# Patient Record
Sex: Female | Born: 1935 | Race: White | Hispanic: No | Marital: Single | Smoking: Never smoker
Health system: Southern US, Community
[De-identification: ages and names within clinical notes are randomized; demographics above are authoritative.]

## PROBLEM LIST (undated history)

## (undated) DIAGNOSIS — I2510 Atherosclerotic heart disease of native coronary artery without angina pectoris: Secondary | ICD-10-CM

## (undated) DIAGNOSIS — I4891 Unspecified atrial fibrillation: Secondary | ICD-10-CM

## (undated) DIAGNOSIS — I10 Essential (primary) hypertension: Secondary | ICD-10-CM

## (undated) DIAGNOSIS — E785 Hyperlipidemia, unspecified: Secondary | ICD-10-CM

## (undated) HISTORY — PX: CARDIAC SURGERY: SHX584

---

## 2008-04-15 HISTORY — PX: CORONARY ARTERY BYPASS GRAFT: SHX141

## 2017-09-09 ENCOUNTER — Inpatient Hospital Stay (HOSPITAL_COMMUNITY)
Admission: EM | Admit: 2017-09-09 | Discharge: 2017-09-15 | DRG: 291 | Disposition: A | Discharge status: Home / Self Care | Payer: Medicare Other | Attending: Internal Medicine | Admitting: Internal Medicine

## 2017-09-09 ENCOUNTER — Emergency Department (HOSPITAL_COMMUNITY): Payer: Medicare Other

## 2017-09-09 DIAGNOSIS — R7303 Prediabetes: Secondary | ICD-10-CM | POA: Diagnosis present

## 2017-09-09 DIAGNOSIS — I480 Paroxysmal atrial fibrillation: Secondary | ICD-10-CM | POA: Diagnosis present

## 2017-09-09 DIAGNOSIS — Z823 Family history of stroke: Secondary | ICD-10-CM

## 2017-09-09 DIAGNOSIS — R0602 Shortness of breath: Secondary | ICD-10-CM

## 2017-09-09 DIAGNOSIS — I2510 Atherosclerotic heart disease of native coronary artery without angina pectoris: Secondary | ICD-10-CM | POA: Diagnosis present

## 2017-09-09 DIAGNOSIS — I4891 Unspecified atrial fibrillation: Secondary | ICD-10-CM | POA: Diagnosis present

## 2017-09-09 DIAGNOSIS — Z8249 Family history of ischemic heart disease and other diseases of the circulatory system: Secondary | ICD-10-CM

## 2017-09-09 DIAGNOSIS — I110 Hypertensive heart disease with heart failure: Principal | ICD-10-CM | POA: Diagnosis present

## 2017-09-09 DIAGNOSIS — I5031 Acute diastolic (congestive) heart failure: Secondary | ICD-10-CM | POA: Diagnosis present

## 2017-09-09 DIAGNOSIS — E785 Hyperlipidemia, unspecified: Secondary | ICD-10-CM | POA: Diagnosis present

## 2017-09-09 DIAGNOSIS — J209 Acute bronchitis, unspecified: Secondary | ICD-10-CM

## 2017-09-09 DIAGNOSIS — Z7982 Long term (current) use of aspirin: Secondary | ICD-10-CM

## 2017-09-09 DIAGNOSIS — Z951 Presence of aortocoronary bypass graft: Secondary | ICD-10-CM

## 2017-09-09 DIAGNOSIS — I083 Combined rheumatic disorders of mitral, aortic and tricuspid valves: Secondary | ICD-10-CM | POA: Diagnosis present

## 2017-09-09 DIAGNOSIS — I509 Heart failure, unspecified: Secondary | ICD-10-CM

## 2017-09-09 DIAGNOSIS — I420 Dilated cardiomyopathy: Secondary | ICD-10-CM | POA: Diagnosis present

## 2017-09-09 DIAGNOSIS — J810 Acute pulmonary edema: Secondary | ICD-10-CM

## 2017-09-09 DIAGNOSIS — J9601 Acute respiratory failure with hypoxia: Secondary | ICD-10-CM | POA: Diagnosis present

## 2017-09-09 DIAGNOSIS — J9811 Atelectasis: Secondary | ICD-10-CM | POA: Diagnosis present

## 2017-09-09 DIAGNOSIS — Z79899 Other long term (current) drug therapy: Secondary | ICD-10-CM

## 2017-09-09 DIAGNOSIS — Z7901 Long term (current) use of anticoagulants: Secondary | ICD-10-CM

## 2017-09-09 HISTORY — DX: Unspecified atrial fibrillation: I48.91

## 2017-09-09 HISTORY — DX: Essential (primary) hypertension: I10

## 2017-09-09 HISTORY — DX: Hyperlipidemia, unspecified: E78.5

## 2017-09-09 HISTORY — DX: Atherosclerotic heart disease of native coronary artery without angina pectoris: I25.10

## 2017-09-09 NOTE — ED Triage Notes (Addendum)
Per EMS, pt coming from home with complaints of worsening SOB for x1 week. Pt has been in [place] for the last two weeks and flew back yesterday, pt was worried it was jet lag. Pt did fall and hit face in [place] with swelling noted on face. Pt does have large Goiter on neck. Pt denies chest pain or any weakness at this time. Wheezing noted in upper airway. Pt received  albuterol, .  atrovent,  solumedrol via EMS. 93% R.O.

## 2017-09-10 ENCOUNTER — Emergency Department (HOSPITAL_COMMUNITY): Payer: Medicare Other

## 2017-09-10 ENCOUNTER — Other Ambulatory Visit: Payer: Self-pay

## 2017-09-10 ENCOUNTER — Inpatient Hospital Stay (HOSPITAL_COMMUNITY): Payer: Medicare Other

## 2017-09-10 ENCOUNTER — Encounter (HOSPITAL_COMMUNITY): Payer: Self-pay | Admitting: Emergency Medicine

## 2017-09-10 DIAGNOSIS — J9811 Atelectasis: Secondary | ICD-10-CM | POA: Diagnosis present

## 2017-09-10 DIAGNOSIS — I340 Nonrheumatic mitral (valve) insufficiency: Secondary | ICD-10-CM

## 2017-09-10 DIAGNOSIS — Z7901 Long term (current) use of anticoagulants: Secondary | ICD-10-CM | POA: Diagnosis not present

## 2017-09-10 DIAGNOSIS — I4891 Unspecified atrial fibrillation: Secondary | ICD-10-CM | POA: Diagnosis present

## 2017-09-10 DIAGNOSIS — I509 Heart failure, unspecified: Secondary | ICD-10-CM

## 2017-09-10 DIAGNOSIS — Z8249 Family history of ischemic heart disease and other diseases of the circulatory system: Secondary | ICD-10-CM | POA: Diagnosis not present

## 2017-09-10 DIAGNOSIS — I420 Dilated cardiomyopathy: Secondary | ICD-10-CM | POA: Diagnosis present

## 2017-09-10 DIAGNOSIS — Z7982 Long term (current) use of aspirin: Secondary | ICD-10-CM | POA: Diagnosis not present

## 2017-09-10 DIAGNOSIS — I110 Hypertensive heart disease with heart failure: Secondary | ICD-10-CM | POA: Diagnosis present

## 2017-09-10 DIAGNOSIS — Z79899 Other long term (current) drug therapy: Secondary | ICD-10-CM | POA: Diagnosis not present

## 2017-09-10 DIAGNOSIS — Z823 Family history of stroke: Secondary | ICD-10-CM | POA: Diagnosis not present

## 2017-09-10 DIAGNOSIS — I5031 Acute diastolic (congestive) heart failure: Secondary | ICD-10-CM | POA: Diagnosis present

## 2017-09-10 DIAGNOSIS — J810 Acute pulmonary edema: Secondary | ICD-10-CM | POA: Diagnosis not present

## 2017-09-10 DIAGNOSIS — I480 Paroxysmal atrial fibrillation: Secondary | ICD-10-CM | POA: Diagnosis present

## 2017-09-10 DIAGNOSIS — R7303 Prediabetes: Secondary | ICD-10-CM | POA: Diagnosis present

## 2017-09-10 DIAGNOSIS — R0602 Shortness of breath: Secondary | ICD-10-CM | POA: Diagnosis present

## 2017-09-10 DIAGNOSIS — E785 Hyperlipidemia, unspecified: Secondary | ICD-10-CM | POA: Diagnosis present

## 2017-09-10 DIAGNOSIS — I083 Combined rheumatic disorders of mitral, aortic and tricuspid valves: Secondary | ICD-10-CM | POA: Diagnosis present

## 2017-09-10 DIAGNOSIS — I2510 Atherosclerotic heart disease of native coronary artery without angina pectoris: Secondary | ICD-10-CM | POA: Diagnosis present

## 2017-09-10 DIAGNOSIS — Z951 Presence of aortocoronary bypass graft: Secondary | ICD-10-CM | POA: Diagnosis not present

## 2017-09-10 DIAGNOSIS — J209 Acute bronchitis, unspecified: Secondary | ICD-10-CM | POA: Diagnosis present

## 2017-09-10 DIAGNOSIS — J9601 Acute respiratory failure with hypoxia: Secondary | ICD-10-CM | POA: Diagnosis present

## 2017-09-10 LAB — CBC WITH DIFFERENTIAL/PLATELET
Abs Immature Granulocytes: 0 10*3/uL (ref 0.0–0.1)
BASOS ABS: 0 10*3/uL (ref 0.0–0.1)
BASOS PCT: 1 %
EOS ABS: 0 10*3/uL (ref 0.0–0.7)
EOS PCT: 1 %
HCT: 33.5 % — ABNORMAL LOW (ref 36.0–46.0)
HEMOGLOBIN: 10.6 g/dL — AB (ref 12.0–15.0)
Immature Granulocytes: 1 %
LYMPHS PCT: 11 %
Lymphs Abs: 0.9 10*3/uL (ref 0.7–4.0)
MCH: 26.7 pg (ref 26.0–34.0)
MCHC: 31.6 g/dL (ref 30.0–36.0)
MCV: 84.4 fL (ref 78.0–100.0)
Monocytes Absolute: 0.8 10*3/uL (ref 0.1–1.0)
Monocytes Relative: 9 %
Neutro Abs: 6.9 10*3/uL (ref 1.7–7.7)
Neutrophils Relative %: 79 %
Platelets: 241 10*3/uL (ref 150–400)
RBC: 3.97 MIL/uL (ref 3.87–5.11)
RDW: 14.8 % (ref 11.5–15.5)
WBC: 8.7 10*3/uL (ref 4.0–10.5)

## 2017-09-10 LAB — I-STAT ARTERIAL BLOOD GAS, ED
ACID-BASE DEFICIT: 6 mmol/L — AB (ref 0.0–2.0)
Bicarbonate: 18.8 mmol/L — ABNORMAL LOW (ref 20.0–28.0)
O2 SAT: 90 %
PH ART: 7.368 (ref 7.350–7.450)
PO2 ART: 60 mmHg — AB (ref 83.0–108.0)
TCO2: 20 mmol/L — ABNORMAL LOW (ref 22–32)
pCO2 arterial: 32.7 mmHg (ref 32.0–48.0)

## 2017-09-10 LAB — GLUCOSE, CAPILLARY: Glucose-Capillary: 153 mg/dL — ABNORMAL HIGH (ref 65–99)

## 2017-09-10 LAB — I-STAT CHEM 8, ED
BUN: 20 mg/dL (ref 6–20)
CHLORIDE: 97 mmol/L — AB (ref 101–111)
Calcium, Ion: 1.13 mmol/L — ABNORMAL LOW (ref 1.15–1.40)
Creatinine, Ser: 0.9 mg/dL (ref 0.44–1.00)
GLUCOSE: 132 mg/dL — AB (ref 65–99)
HCT: 33 % — ABNORMAL LOW (ref 36.0–46.0)
HEMOGLOBIN: 11.2 g/dL — AB (ref 12.0–15.0)
Potassium: 3.7 mmol/L (ref 3.5–5.1)
SODIUM: 131 mmol/L — AB (ref 135–145)
TCO2: 21 mmol/L — AB (ref 22–32)

## 2017-09-10 LAB — BRAIN NATRIURETIC PEPTIDE: B NATRIURETIC PEPTIDE 5: 393 pg/mL — AB (ref 0.0–100.0)

## 2017-09-10 LAB — ECHOCARDIOGRAM COMPLETE

## 2017-09-10 LAB — I-STAT TROPONIN, ED: TROPONIN I, POC: 0 ng/mL (ref 0.00–0.08)

## 2017-09-10 MED ORDER — FUROSEMIDE 10 MG/ML IJ SOLN
40.0000 mg | Freq: Once | INTRAMUSCULAR | Status: AC
  Administered 2017-09-10: 40 mg via INTRAVENOUS
  Filled 2017-09-10: qty 4

## 2017-09-10 MED ORDER — ASPIRIN EC 81 MG PO TBEC
81.0000 mg | DELAYED_RELEASE_TABLET | Freq: Every day | ORAL | Status: DC
  Administered 2017-09-10 – 2017-09-15 (×6): 81 mg via ORAL
  Filled 2017-09-10 (×6): qty 1

## 2017-09-10 MED ORDER — SODIUM CHLORIDE 0.9 % IV SOLN: 250.0000 mL | INTRAVENOUS | Status: DC | PRN

## 2017-09-10 MED ORDER — ROSUVASTATIN CALCIUM 10 MG PO TABS
20.0000 mg | ORAL_TABLET | Freq: Every day | ORAL | Status: DC
  Administered 2017-09-10 – 2017-09-15 (×6): 20 mg via ORAL
  Filled 2017-09-10 (×2): qty 2
  Filled 2017-09-10: qty 1
  Filled 2017-09-10 (×3): qty 2
  Filled 2017-09-10: qty 1

## 2017-09-10 MED ORDER — FUROSEMIDE 10 MG/ML IJ SOLN: 40.0000 mg | Freq: Every day | INTRAMUSCULAR | Status: DC

## 2017-09-10 MED ORDER — IOPAMIDOL (ISOVUE-370) INJECTION 76%
100.0000 mL | Freq: Once | INTRAVENOUS | Status: AC | PRN
  Administered 2017-09-10: 65 mL via INTRAVENOUS

## 2017-09-10 MED ORDER — METOPROLOL SUCCINATE ER 25 MG PO TB24
75.0000 mg | ORAL_TABLET | Freq: Every day | ORAL | Status: DC
  Administered 2017-09-10 – 2017-09-15 (×6): 75 mg via ORAL
  Filled 2017-09-10 (×7): qty 1

## 2017-09-10 MED ORDER — FUROSEMIDE 10 MG/ML IJ SOLN
40.0000 mg | Freq: Two times a day (BID) | INTRAMUSCULAR | Status: AC
  Administered 2017-09-10 (×2): 40 mg via INTRAVENOUS
  Filled 2017-09-10 (×2): qty 4

## 2017-09-10 MED ORDER — ONDANSETRON HCL 4 MG/2ML IJ SOLN: 4.0000 mg | Freq: Four times a day (QID) | INTRAMUSCULAR | Status: DC | PRN

## 2017-09-10 MED ORDER — SODIUM CHLORIDE 0.9% FLUSH: 3.0000 mL | INTRAVENOUS | Status: DC | PRN

## 2017-09-10 MED ORDER — IOPAMIDOL (ISOVUE-370) INJECTION 76%
INTRAVENOUS | Status: AC
  Filled 2017-09-10: qty 100

## 2017-09-10 MED ORDER — SODIUM CHLORIDE 0.9% FLUSH
3.0000 mL | Freq: Two times a day (BID) | INTRAVENOUS | Status: DC
  Administered 2017-09-10 – 2017-09-15 (×11): 3 mL via INTRAVENOUS

## 2017-09-10 MED ORDER — ACETAMINOPHEN 325 MG PO TABS: 650.0000 mg | ORAL_TABLET | ORAL | Status: DC | PRN

## 2017-09-10 MED ORDER — APIXABAN 2.5 MG PO TABS
5.0000 mg | ORAL_TABLET | Freq: Two times a day (BID) | ORAL | Status: DC
  Administered 2017-09-10 – 2017-09-15 (×11): 5 mg via ORAL
  Filled 2017-09-10: qty 1
  Filled 2017-09-10 (×8): qty 2
  Filled 2017-09-10 (×2): qty 1
  Filled 2017-09-10 (×3): qty 2

## 2017-09-10 NOTE — ED Notes (Signed)
Pt alert, recently ambulated to bed side commode in room, no distress noted, denies complaints

## 2017-09-10 NOTE — Progress Notes (Signed)
  Echocardiogram 2D Echocardiogram has been performed.  Celene Skeen 09/10/2017, 3:29 PM

## 2017-09-10 NOTE — ED Triage Notes (Signed)
TC to pharmacy to request meds

## 2017-09-10 NOTE — ED Notes (Signed)
Resting on stretcher at present. No complaints.

## 2017-09-10 NOTE — ED Triage Notes (Signed)
Requested meds from Pharmacy.

## 2017-09-10 NOTE — ED Provider Notes (Signed)
[facility] EMERGENCY DEPARTMENT Provider Note   CSN: [id number] Arrival date & time: 09/09/17  2317     History   Chief Complaint Chief Complaint  Patient presents with  . Shortness of Breath    HPI Debbie Parker is a 84 y.o. female.  The history is provided by the patient and a relative.  Shortness of Breath  This is a new problem. The average episode lasts 4 days. The problem occurs continuously.The current episode started more than 2 days ago. The problem has been gradually worsening. Associated symptoms include wheezing. Pertinent negatives include no fever, no coryza, no neck pain, no cough, no sputum production, no hemoptysis, no vomiting and no abdominal pain. It is unknown what precipitated the problem. Risk factors include recent prolonged sitting. She has tried nothing for the symptoms. The treatment provided no relief. She has had no prior ICU admissions. Associated medical issues do not include PE.  Was in [place] and missed 2 weeks of Eliquis that she used for plaque in her neck, SOB the last few days.   Was supposed to travel home to [place] but was too short of breath tonight.  No f/c/r.  Fell in [place] 2 weeks ago obtaining facial bruising.  No n/v/d.  No weakness    Past Medical History:  Diagnosis Date  . A-fib (HCC)   . Coronary artery disease   . Hyperlipidemia   . Hypertension     Patient Active Problem List   Diagnosis Date Noted  . A-fib (HCC) 09/10/2017    Past Surgical History:  Procedure Laterality Date  . CARDIAC SURGERY    . CORONARY ARTERY BYPASS GRAFT  2010     OB History   None      Home Medications    Prior to Admission medications   Not on File    Family History History reviewed. No pertinent family history.  Social History Social History   Tobacco Use  . Smoking status: Not on file  . Smokeless tobacco: Never Used  Substance Use Topics  . Alcohol use: Not Currently    Frequency: Never  . Drug use:  Not Currently     Allergies   Patient has no allergy information on record.   Review of Systems Review of Systems  Constitutional: Negative for diaphoresis and fever.  HENT: Negative for drooling.   Eyes: Negative for visual disturbance.  Respiratory: Positive for shortness of breath and wheezing. Negative for cough, hemoptysis and sputum production.   Gastrointestinal: Negative for abdominal pain and vomiting.  Genitourinary: Negative for flank pain.  Musculoskeletal: Negative for back pain and neck pain.  Neurological: Negative for syncope, facial asymmetry and numbness.  Hematological: Negative for adenopathy.  All other systems reviewed and are negative.    Physical Exam Updated Vital Signs BP (!) 120/97   Pulse (!) 104   Temp 97.7 F (36.5 C) (Oral)   Resp (!) 28   SpO2 92%   Physical Exam  Constitutional: She is oriented to person, place, and time. She appears well-developed and well-nourished. No distress.  HENT:  Head: Normocephalic. Head is without raccoon's eyes and without Battle's sign.    Right Ear: No hemotympanum.  Left Ear: No hemotympanum.  Eyes: Pupils are equal, round, and reactive to light. Conjunctivae are normal.  Neck: Normal range of motion. Neck supple.  Cardiovascular: Normal rate and intact distal pulses. An irregular rhythm present.  Pulmonary/Chest: Tachypnea noted. She has wheezes.  Abdominal: Soft. Bowel sounds are  normal. She exhibits no mass. There is no tenderness. There is no rebound and no guarding.  Musculoskeletal: She exhibits no tenderness.  Lymphadenopathy:    She has no cervical adenopathy.  Neurological: She is alert and oriented to person, place, and time. She displays normal reflexes.  Skin: Skin is warm and dry. Capillary refill takes less than 2 seconds.  Psychiatric: She has a normal mood and affect.     ED Treatments / Results  Labs (all labs ordered are listed, but only abnormal results are displayed) Results  for orders placed or performed during the hospital encounter of 09/09/17  Brain natriuretic peptide  Result Value Ref Range   B Natriuretic Peptide 393.0 (H) 0.0 - 100.0 pg/mL  CBC with Differential  Result Value Ref Range   WBC 8.7 4.0 - 10.5 K/uL   RBC 3.97 3.87 - 5.11 MIL/uL   Hemoglobin 10.6 (L) 12.0 - 15.0 g/dL   HCT 16.1 (L) 09.6 - 04.5 %   MCV 84.4 78.0 - 100.0 fL   MCH 26.7 26.0 - 34.0 pg   MCHC 31.6 30.0 - 36.0 g/dL   RDW 40.9 81.1 - 91.4 %   Platelets 241 150 - 400 K/uL   Neutrophils Relative % 79 %   Neutro Abs 6.9 1.7 - 7.7 K/uL   Lymphocytes Relative 11 %   Lymphs Abs 0.9 0.7 - 4.0 K/uL   Monocytes Relative 9 %   Monocytes Absolute 0.8 0.1 - 1.0 K/uL   Eosinophils Relative 1 %   Eosinophils Absolute 0.0 0.0 - 0.7 K/uL   Basophils Relative 1 %   Basophils Absolute 0.0 0.0 - 0.1 K/uL   Immature Granulocytes 1 %   Abs Immature Granulocytes 0.0 0.0 - 0.1 K/uL  I-Stat Troponin, ED (not at [facility])  Result Value Ref Range   Troponin i, poc 0.00 0.00 - 0.08 ng/mL   Comment 3          I-Stat Chem 8, ED  Result Value Ref Range   Sodium 131 (L) 135 - 145 mmol/L   Potassium 3.7 3.5 - 5.1 mmol/L   Chloride 97 (L) 101 - 111 mmol/L   BUN 20 6 - 20 mg/dL   Creatinine, Ser 7.82 0.44 - 1.00 mg/dL   Glucose, Bld 956 (H) 65 - 99 mg/dL   Calcium, Ion 2.13 (L) 1.15 - 1.40 mmol/L   TCO2 21 (L) 22 - 32 mmol/L   Hemoglobin 11.2 (L) 12.0 - 15.0 g/dL   HCT 08.6 (L) 57.8 - 46.9 %  I-Stat arterial blood gas, ED  Result Value Ref Range   pH, Arterial 7.368 7.350 - 7.450   pCO2 arterial 32.7 32.0 - 48.0 mmHg   pO2, Arterial 60.0 (L) 83.0 - 108.0 mmHg   Bicarbonate 18.8 (L) 20.0 - 28.0 mmol/L   TCO2 20 (L) 22 - 32 mmol/L   O2 Saturation 90.0 %   Acid-base deficit 6.0 (H) 0.0 - 2.0 mmol/L   Patient temperature 98.6 F    Collection site RADIAL, ALLEN'S TEST ACCEPTABLE    Drawn by RT    Sample type ARTERIAL    Ct Angio Chest Pe W And/or Wo Contrast  Result Date: 09/10/2017 CLINICAL  DATA:  84 y/o  F; 1 week of shortness of breath. EXAM: CT ANGIOGRAPHY CHEST WITH CONTRAST TECHNIQUE: Multidetector CT imaging of the chest was performed using the standard protocol during bolus administration of intravenous contrast. Multiplanar CT image reconstructions and MIPs were obtained to evaluate the vascular anatomy.  CONTRAST:  65mL ISOVUE-370 IOPAMIDOL (ISOVUE-370) INJECTION 76% COMPARISON:  09/09/2017 chest radiograph. FINDINGS: Cardiovascular: 4 cm ascending aorta. Moderate calcific atherosclerosis of aorta. Status post CABG with LIMA and saphenous grafts. Cardiomegaly with severe biatrial enlargement. Coronary artery and aortic valvular calcific atherosclerosis. Respiratory motion artifact. Satisfactory opacification of pulmonary arteries. No pulmonary embolus identified. Mediastinum/Nodes: Thyroid isthmus nodule measuring 4.4 x 3.7 cm. No mediastinal adenopathy. Small hiatal hernia. Lungs/Pleura: Small right larger than left pleural effusions. Smooth interlobular septal thickening. Patchy dependent opacities in the lower lobes. Upper Abdomen: No acute abnormality. Musculoskeletal: No chest wall abnormality. No acute or significant osseous findings. Review of the MIP images confirms the above findings. IMPRESSION: 1. No pulmonary embolus identified. 2. Interstitial pulmonary edema and small bilateral pleural effusions. Mild dependent atelectasis in the lower lobes. 3. 4 cm ascending aortic aneurysm. Recommend annual imaging followup by CTA or MRA. This recommendation follows 2010 ACCF/AHA/AATS/ACR/ASA/SCA/SCAI/SIR/STS/SVM Guidelines for the Diagnosis and Management of Patients with Thoracic Aortic Disease. Circulation. 2010; 121: R604-V409. 4. Cardiomegaly with severe biatrial enlargement. 5. Coronary artery, aortic, and aortic valvular calcific atherosclerosis. 6. Thyroid isthmus nodule measuring up to 4.4 cm. Thyroid ultrasound is recommended on a nonemergent basis. 7. Small hiatal hernia.  Electronically Signed   By: Mitzi Hansen M.D.   On: 09/10/2017 01:18   Dg Chest Port 1 View  Result Date: 09/10/2017 CLINICAL DATA:  Acute onset of shortness of breath. Facial swelling. Wheezing. EXAM: PORTABLE CHEST 1 VIEW COMPARISON:  None. FINDINGS: The lungs are well-aerated. Small bilateral pleural effusions are noted. Increased interstitial markings may reflect mild interstitial edema. No pneumothorax is seen. The cardiomediastinal silhouette is mildly enlarged. The patient is status post median sternotomy. No acute osseous abnormalities are seen. IMPRESSION: Small bilateral pleural effusions noted. Increased interstitial markings may reflect mild interstitial edema. Mild cardiomegaly. Electronically Signed   By: Roanna Raider M.D.   On: 09/10/2017 00:03    EKG EKG Interpretation  Date/Time:  Tuesday Sep 09 2017 23:52:35 EDT Ventricular Rate:  115 PR Interval:    QRS Duration: 90 QT Interval:  296 QTC Calculation: 410 R Axis:   42 Text Interpretation:  Atrial fibrillation Nonspecific repol abnormality, diffuse leads Confirmed by Nicanor Alcon, Ceanna Wareing ([id number]) on 09/10/2017 12:24:11 AM   Radiology Ct Angio Chest Pe W And/or Wo Contrast  Result Date: 09/10/2017 CLINICAL DATA:  84 y/o  F; 1 week of shortness of breath. EXAM: CT ANGIOGRAPHY CHEST WITH CONTRAST TECHNIQUE: Multidetector CT imaging of the chest was performed using the standard protocol during bolus administration of intravenous contrast. Multiplanar CT image reconstructions and MIPs were obtained to evaluate the vascular anatomy. CONTRAST:  65mL ISOVUE-370 IOPAMIDOL (ISOVUE-370) INJECTION 76% COMPARISON:  09/09/2017 chest radiograph. FINDINGS: Cardiovascular: 4 cm ascending aorta. Moderate calcific atherosclerosis of aorta. Status post CABG with LIMA and saphenous grafts. Cardiomegaly with severe biatrial enlargement. Coronary artery and aortic valvular calcific atherosclerosis. Respiratory motion artifact. Satisfactory  opacification of pulmonary arteries. No pulmonary embolus identified. Mediastinum/Nodes: Thyroid isthmus nodule measuring 4.4 x 3.7 cm. No mediastinal adenopathy. Small hiatal hernia. Lungs/Pleura: Small right larger than left pleural effusions. Smooth interlobular septal thickening. Patchy dependent opacities in the lower lobes. Upper Abdomen: No acute abnormality. Musculoskeletal: No chest wall abnormality. No acute or significant osseous findings. Review of the MIP images confirms the above findings. IMPRESSION: 1. No pulmonary embolus identified. 2. Interstitial pulmonary edema and small bilateral pleural effusions. Mild dependent atelectasis in the lower lobes. 3. 4 cm ascending aortic aneurysm. Recommend annual imaging followup by CTA  or MRA. This recommendation follows 2010 ACCF/AHA/AATS/ACR/ASA/SCA/SCAI/SIR/STS/SVM Guidelines for the Diagnosis and Management of Patients with Thoracic Aortic Disease. Circulation. 2010; 121: Z610-R604. 4. Cardiomegaly with severe biatrial enlargement. 5. Coronary artery, aortic, and aortic valvular calcific atherosclerosis. 6. Thyroid isthmus nodule measuring up to 4.4 cm. Thyroid ultrasound is recommended on a nonemergent basis. 7. Small hiatal hernia. Electronically Signed   By: Mitzi Hansen M.D.   On: 09/10/2017 01:18   Dg Chest Port 1 View  Result Date: 09/10/2017 CLINICAL DATA:  Acute onset of shortness of breath. Facial swelling. Wheezing. EXAM: PORTABLE CHEST 1 VIEW COMPARISON:  None. FINDINGS: The lungs are well-aerated. Small bilateral pleural effusions are noted. Increased interstitial markings may reflect mild interstitial edema. No pneumothorax is seen. The cardiomediastinal silhouette is mildly enlarged. The patient is status post median sternotomy. No acute osseous abnormalities are seen. IMPRESSION: Small bilateral pleural effusions noted. Increased interstitial markings may reflect mild interstitial edema. Mild cardiomegaly. Electronically  Signed   By: Roanna Raider M.D.   On: 09/10/2017 00:03    Procedures Procedures (including critical care time)  Medications Ordered in ED Medications  furosemide (LASIX) injection 40 mg (has no administration in time range)  metoprolol succinate (TOPROL-XL) 24 hr tablet 75 mg (has no administration in time range)  apixaban (ELIQUIS) tablet 5 mg (has no administration in time range)  rosuvastatin (CRESTOR) tablet 20 mg (has no administration in time range)  aspirin EC tablet 81 mg (has no administration in time range)  iopamidol (ISOVUE-370) 76 % injection 100 mL (65 mLs Intravenous Contrast Given 09/10/17 0051)   MDM Reviewed: previous chart, nursing note and vitals Interpretation: labs, ECG, x-ray and ultrasound (negative troponin pleural effusions on CXR and CT ) Consults: admitting MD      Final Clinical Impressions(s) / ED Diagnoses   Final diagnoses:  Atrial fibrillation, unspecified type (HCC)  Acute pulmonary edema (HCC)    Admit to medicine    Marian Grandt, MD 09/10/17 0200

## 2017-09-10 NOTE — Progress Notes (Signed)
  PROGRESS NOTE    Julyana Parker  ZHY:[id number] DOB: December 28, 1929 DOA: 09/09/2017 PCP: System, Provider Not In   Patient admitted by Dr. Julian Reil this morning for shortness of breath secondary to fluid overload.  When I saw the patient she appeared more comfortable as she had received a dose of IV Lasix and put out close to 1 L of urine.  CT of the chest and earlier was negative for pulmonary embolism but had showed signs of fluid overload.  Patient denied any previous history of CHF.  She has had progressive increasing orthopnea and lower extremity swelling over the course of past few weeks but denies any chest pain.  She also admits of having productive coughing bringing up clear sputum, denies fevers and chills.  No sick contacts.  General = no fevers, chills, dizziness, malaise, fatigue HEENT/EYES = negative for pain, redness, loss of vision, double vision, blurred vision, loss of hearing, sore throat, hoarseness, dysphagia Cardiovascular= negative for chest pain, palpitation, murmurs, lower extremity swelling Respiratory/lungs= negative for hemoptysis, wheezing, mucus production Gastrointestinal= negative for nausea, vomiting,, abdominal pain, melena, hematemesis Genitourinary= negative for Dysuria, Hematuria, Change in Urinary Frequency MSK = Negative for arthralgia, myalgias, Back Pain, Joint swelling  Neurology= Negative for headache, seizures, numbness, tingling  Psychiatry= Negative for anxiety, depression, suicidal and homocidal ideation Allergy/Immunology= Medication/Food allergy as listed  Skin= Negative for Rash, lesions, ulcers, itching  Vitals:   09/10/17 0645 09/10/17 0742  BP: (!) 145/85 122/83  Pulse: 86 (!) 104  Resp: (!) 24 (!) 22  Temp:    SpO2: 99% 99%   Constitutional: NAD, calm, comfortable Eyes: PERRL, lids and conjunctivae normal ENMT: Mucous membranes are moist. Posterior pharynx clear of any exudate or lesions.Normal dentition.  Neck: normal, supple, no masses,  no thyromegaly.  12 cm JVD Respiratory: Bibasilar crackles Cardiovascular: Regular rate and rhythm, no murmurs / rubs / gallops.  1+ bilateral lower extremity pitting edema-she has compression stockings in place from home.. 2+ pedal pulses. No carotid bruits.  Abdomen: no tenderness, no masses palpated. No hepatosplenomegaly. Bowel sounds positive.  Musculoskeletal: no clubbing / cyanosis. No joint deformity upper and lower extremities. Good ROM, no contractures. Normal muscle tone.  Skin: no rashes, lesions, ulcers. No induration Neurologic: CN 2-12 grossly intact. Sensation intact, DTR normal. Strength 5/5 in all 4.  Psychiatric: Normal judgment and insight. Alert and oriented x 3. Normal mood.   CT of the chest is negative for pulmonary embolism but suggestive of pulmonary edema. EKG does not show any acute ST-T changes  Assessment and plan: Mild to moderate respiratory distress secondary to fluid overload - Patient does not have any diagnosis of cardiomyopathy.  Echocardiogram has been ordered.  We will increase her daily IV Lasix to twice daily for 2 more doses as she had responded well to 1 dose overnight.  We will reassess her tomorrow morning to see if she needs further more dosing.  Closely monitor electrolytes.  We will place her on fluid restriction.  Daily weight, check input output.  Closely monitor electrolytes  History of atrial fibrillation -Continue Eliquis.  Currently rate controlled on Toprol-XL 75 mg  Hyperlipidemia -Continue Crestor  Mirayah Wren Joline Maxcy, MD [facility] Pager [phone]   If 7PM-7AM, please contact night-coverage [email or web address] Password [facility] 09/10/2017, 11:49 AM

## 2017-09-10 NOTE — H&P (Signed)
History and Physical    Kimisha Eunice UJW:[id number] DOB: [birth year] DOA: 09/09/2017  PCP: System, Provider Not In  Patient coming from: Home  I have personally briefly reviewed patient's old medical records in [facility]  Chief Complaint: SOB  HPI: Debbie Parker is a 84 y.o. female with medical history significant of a.fib on eliquis (which she has missed for the past couple of weeks), HTN, HLD, CAD s/p CABG in 2010.  Patient does not have PMH of CHF previously.  Patient presents to the ED with c/o progressive SOB for the past 4 days.  She also has baseline DOE and orthopnea as well.  Patient recently traveled to [place] and just flew back to the area, was supposed to travel home to [place] at 8am this morning but too short of breath.  Did have fall 2 weeks ago in [place] and has hematoma over eye from this.  BLE edema has been ongoing for the past months to year.   ED Course: CT chest demonstrates no PE, does show pulmonary edema though.   IV lasix ordered.   Review of Systems: As per HPI otherwise 10 point review of systems negative.   Past Medical History:  Diagnosis Date  . A-fib (HCC)   . Coronary artery disease   . Hyperlipidemia   . Hypertension     Past Surgical History:  Procedure Laterality Date  . CARDIAC SURGERY    . CORONARY ARTERY BYPASS GRAFT  2010     does not have a smoking history on file. She has never used smokeless tobacco. She reports that she drank alcohol. She reports that she has current or past drug history.  No Known Allergies  Family History  Problem Relation Age of Onset  . CAD Mother   . Stroke Brother      Prior to Admission medications   Medication Sig Start Date End Date Taking? Authorizing Provider  albuterol (PROVENTIL HFA;VENTOLIN HFA) 108 (90 Base) MCG/ACT inhaler Inhale 1-2 puffs into the lungs every 6 (six) hours as needed for wheezing or shortness of breath.   Yes [provider]  amLODipine (NORVASC) 10  MG tablet Take 10 mg by mouth daily.   Yes [provider]  apixaban (ELIQUIS) 5 MG TABS tablet Take 5 mg by mouth every morning.   Yes [provider]  aspirin EC 81 MG tablet Take 81 mg by mouth daily.   Yes [provider]  cholecalciferol (VITAMIN D) 1000 units tablet Take 2,000 Units by mouth daily.   Yes [provider]  metoprolol succinate (TOPROL-XL) 50 MG 24 hr tablet Take 75 mg by mouth daily. Take with or immediately following a meal.   Yes [provider]  rosuvastatin (CRESTOR) 20 MG tablet Take 20 mg by mouth daily.   Yes [provider]    Physical Exam: Vitals:   09/09/17 2329 09/09/17 2355 09/10/17 0000 09/10/17 0003  BP: 115/82  (!) 120/97   Pulse: 77 100 (!) 123 (!) 104  Resp: (!) 22 (!) 29 (!) 35 (!) 28  Temp: 97.7 F (36.5 C)     TempSrc: Oral     SpO2: (!) 88% 90% 90% 92%    Constitutional: NAD, calm, comfortable Eyes: PERRL, lids and conjunctivae normal, hematoma over L eye ENMT: Mucous membranes are moist. Posterior pharynx clear of any exudate or lesions.Normal dentition.  Neck: normal, supple, no masses, no thyromegaly Respiratory: Bibasilar crackles Cardiovascular: Regular rate and rhythm, no murmurs / rubs / gallops.  No extremity edema. 2+ pedal pulses. No carotid bruits.  Abdomen: no tenderness, no masses palpated. No hepatosplenomegaly. Bowel sounds positive.  Musculoskeletal: no clubbing / cyanosis. No joint deformity upper and lower extremities. Good ROM, no contractures. Normal muscle tone.  Skin: no rashes, lesions, ulcers. No induration Neurologic: CN 2-12 grossly intact. Sensation intact, DTR normal. Strength 5/5 in all 4.  Psychiatric: Normal judgment and insight. Alert and oriented x 3. Normal mood.    Labs on Admission: I have personally reviewed following labs and imaging studies  CBC: Recent Labs  Lab 09/10/17 0001 09/10/17 0006  WBC 8.7  --   NEUTROABS 6.9  --   HGB 10.6* 11.2*    HCT 33.5* 33.0*  MCV 84.4  --   PLT 241  --    Basic Metabolic Panel: Recent Labs  Lab 09/10/17 0006  NA 131*  K 3.7  CL 97*  GLUCOSE 132*  BUN 20  CREATININE 0.90   GFR: CrCl cannot be calculated (Unknown ideal weight.). Liver Function Tests: No results for input(s): AST, ALT, ALKPHOS, BILITOT, PROT, ALBUMIN in the last 168 hours. No results for input(s): LIPASE, AMYLASE in the last 168 hours. No results for input(s): AMMONIA in the last 168 hours. Coagulation Profile: No results for input(s): INR, PROTIME in the last 168 hours. Cardiac Enzymes: No results for input(s): CKTOTAL, CKMB, CKMBINDEX, TROPONINI in the last 168 hours. BNP (last 3 results) No results for input(s): PROBNP in the last 8760 hours. HbA1C: No results for input(s): HGBA1C in the last 72 hours. CBG: No results for input(s): GLUCAP in the last 168 hours. Lipid Profile: No results for input(s): CHOL, HDL, LDLCALC, TRIG, CHOLHDL, LDLDIRECT in the last 72 hours. Thyroid Function Tests: No results for input(s): TSH, T4TOTAL, FREET4, T3FREE, THYROIDAB in the last 72 hours. Anemia Panel: No results for input(s): VITAMINB12, FOLATE, FERRITIN, TIBC, IRON, RETICCTPCT in the last 72 hours. Urine analysis: No results found for: COLORURINE, APPEARANCEUR, LABSPEC, PHURINE, GLUCOSEU, HGBUR, BILIRUBINUR, KETONESUR, PROTEINUR, UROBILINOGEN, NITRITE, LEUKOCYTESUR  Radiological Exams on Admission: Ct Angio Chest Pe W And/or Wo Contrast  Result Date: 09/10/2017 CLINICAL DATA:  84 y/o  F; 1 week of shortness of breath. EXAM: CT ANGIOGRAPHY CHEST WITH CONTRAST TECHNIQUE: Multidetector CT imaging of the chest was performed using the standard protocol during bolus administration of intravenous contrast. Multiplanar CT image reconstructions and MIPs were obtained to evaluate the vascular anatomy. CONTRAST:  65mL ISOVUE-370 IOPAMIDOL (ISOVUE-370) INJECTION 76% COMPARISON:  09/09/2017 chest radiograph. FINDINGS: Cardiovascular:  4 cm ascending aorta. Moderate calcific atherosclerosis of aorta. Status post CABG with LIMA and saphenous grafts. Cardiomegaly with severe biatrial enlargement. Coronary artery and aortic valvular calcific atherosclerosis. Respiratory motion artifact. Satisfactory opacification of pulmonary arteries. No pulmonary embolus identified. Mediastinum/Nodes: Thyroid isthmus nodule measuring 4.4 x 3.7 cm. No mediastinal adenopathy. Small hiatal hernia. Lungs/Pleura: Small right larger than left pleural effusions. Smooth interlobular septal thickening. Patchy dependent opacities in the lower lobes. Upper Abdomen: No acute abnormality. Musculoskeletal: No chest wall abnormality. No acute or significant osseous findings. Review of the MIP images confirms the above findings. IMPRESSION: 1. No pulmonary embolus identified. 2. Interstitial pulmonary edema and small bilateral pleural effusions. Mild dependent atelectasis in the lower lobes. 3. 4 cm ascending aortic aneurysm. Recommend annual imaging followup by CTA or MRA. This recommendation follows 2010 ACCF/AHA/AATS/ACR/ASA/SCA/SCAI/SIR/STS/SVM Guidelines for the Diagnosis and Management of Patients with Thoracic Aortic Disease. Circulation. 2010; 121: W098-J191. 4. Cardiomegaly with severe biatrial enlargement. 5. Coronary artery, aortic, and aortic valvular  calcific atherosclerosis. 6. Thyroid isthmus nodule measuring up to 4.4 cm. Thyroid ultrasound is recommended on a nonemergent basis. 7. Small hiatal hernia. Electronically Signed   By: Mitzi Hansen M.D.   On: 09/10/2017 01:18   Dg Chest Port 1 View  Result Date: 09/10/2017 CLINICAL DATA:  Acute onset of shortness of breath. Facial swelling. Wheezing. EXAM: PORTABLE CHEST 1 VIEW COMPARISON:  None. FINDINGS: The lungs are well-aerated. Small bilateral pleural effusions are noted. Increased interstitial markings may reflect mild interstitial edema. No pneumothorax is seen. The cardiomediastinal silhouette is  mildly enlarged. The patient is status post median sternotomy. No acute osseous abnormalities are seen. IMPRESSION: Small bilateral pleural effusions noted. Increased interstitial markings may reflect mild interstitial edema. Mild cardiomegaly. Electronically Signed   By: Roanna Raider M.D.   On: 09/10/2017 00:03    EKG: Independently reviewed.  Assessment/Plan Principal Problem:   New onset of congestive heart failure (HCC) Active Problems:   A-fib (HCC)   Acute respiratory failure with hypoxia (HCC)    1. New onset CHF - Acute CHF, dont know if it systolic or diastolic yet. 1. CHF pathway 2. Lasix  IV daily, first dose now 3. Strict intake and output 4. Daily BMP 5. 2d echo 6. Tele monitor 2. Acute resp failure with hypoxia - secondary to #1 above 3. A.Fib - 1. Continue metoprolol 2. Continue / resume eliquis 4. HTN - 1. Cont Metoprolol 2. Holding CCB for the moment 3. Consider switching to ACEi if BP elevates.  DVT prophylaxis: Eliquis Code Status: Full Family Communication: Family at bedside Disposition Plan: Home after admit Consults called: None Admission status: Admit to inpatient - IP status for new O2 requirement   Hillary Bow DO [facility] Pager [phone]  If 7AM-7PM, please contact day team taking care of patient [email or web address] Password [email or web address]  09/10/2017, 2:06 AM

## 2017-09-11 DIAGNOSIS — J209 Acute bronchitis, unspecified: Secondary | ICD-10-CM

## 2017-09-11 LAB — GLUCOSE, CAPILLARY
GLUCOSE-CAPILLARY: 118 mg/dL — AB (ref 65–99)
GLUCOSE-CAPILLARY: 108 mg/dL — AB (ref 65–99)
GLUCOSE-CAPILLARY: 130 mg/dL — AB (ref 65–99)
GLUCOSE-CAPILLARY: 171 mg/dL — AB (ref 65–99)

## 2017-09-11 LAB — BASIC METABOLIC PANEL
ANION GAP: 13 (ref 5–15)
BUN: 21 mg/dL — ABNORMAL HIGH (ref 6–20)
CHLORIDE: 98 mmol/L — AB (ref 101–111)
CO2: 27 mmol/L (ref 22–32)
Calcium: 8.8 mg/dL — ABNORMAL LOW (ref 8.9–10.3)
Creatinine, Ser: 0.96 mg/dL (ref 0.44–1.00)
GFR calc Af Amer: >60 mL/min (ref >60)
GFR, EST NON AFRICAN AMERICAN: 52 mL/min — AB (ref >60)
GLUCOSE: 128 mg/dL — AB (ref 65–99)
POTASSIUM: 3.2 mmol/L — AB (ref 3.5–5.1)
Sodium: 138 mmol/L (ref 135–145)

## 2017-09-11 LAB — MAGNESIUM: Magnesium: 1.8 mg/dL (ref 1.7–2.4)

## 2017-09-11 MED ORDER — PREDNISONE 20 MG PO TABS
40.0000 mg | ORAL_TABLET | Freq: Every day | ORAL | Status: DC
  Administered 2017-09-11 – 2017-09-13 (×3): 40 mg via ORAL
  Filled 2017-09-11 (×3): qty 2

## 2017-09-11 MED ORDER — LEVALBUTEROL HCL 0.63 MG/3ML IN NEBU
0.6300 mg | INHALATION_SOLUTION | Freq: Three times a day (TID) | RESPIRATORY_TRACT | Status: DC
  Administered 2017-09-12: 0.63 mg via RESPIRATORY_TRACT
  Filled 2017-09-11: qty 3

## 2017-09-11 MED ORDER — FUROSEMIDE 10 MG/ML IJ SOLN
40.0000 mg | Freq: Two times a day (BID) | INTRAMUSCULAR | Status: DC
  Administered 2017-09-11: 40 mg via INTRAVENOUS
  Filled 2017-09-11 (×2): qty 4

## 2017-09-11 MED ORDER — MAGNESIUM OXIDE 400 (241.3 MG) MG PO TABS
800.0000 mg | ORAL_TABLET | Freq: Two times a day (BID) | ORAL | Status: AC
  Administered 2017-09-11 (×2): 800 mg via ORAL
  Filled 2017-09-11 (×2): qty 2

## 2017-09-11 MED ORDER — LEVALBUTEROL HCL 0.63 MG/3ML IN NEBU
0.6300 mg | INHALATION_SOLUTION | Freq: Four times a day (QID) | RESPIRATORY_TRACT | Status: DC
  Administered 2017-09-11 (×2): 0.63 mg via RESPIRATORY_TRACT
  Filled 2017-09-11 (×2): qty 3

## 2017-09-11 MED ORDER — POTASSIUM CHLORIDE CRYS ER 20 MEQ PO TBCR
40.0000 meq | EXTENDED_RELEASE_TABLET | Freq: Two times a day (BID) | ORAL | Status: AC
  Administered 2017-09-11 (×2): 40 meq via ORAL
  Filled 2017-09-11 (×2): qty 2

## 2017-09-11 NOTE — Discharge Instructions (Signed)

## 2017-09-11 NOTE — Evaluation (Signed)
Physical Therapy Evaluation Patient Details Name: Debbie Parker MRN: [id number] DOB: 1930/01/29 Today's Date: 09/11/2017   History of Present Illness  84 year old female with history of atrial fibrillation on Eliquis, CAD status post CABG in 2010, hypertension, hyperlipidemia came to the hospital with complaints of progressive shortness of breath over 4 days.  Initially due to patient's recent long distance travel she had a CT of the chest done which was negative for pulmonary embolism but showed signs of fluid overload.  She was started on IV Lasix for diuresis.  Clinical Impression  PTA pt was in [place] sightseeing with increasing difficulty with SoB and endurance as the trip progressed. Pt lives alone and was independent in mobility and ADLs. Pt is currently limited in her safe mobility by oxygen desaturation (see General Comments), SoB and decreased endurance. Pt is currently mod I for bed mobility, supervision for transfers and minA for ambulation without AD as she has mild instability in her gait. PT recommends d/c to her daughters home to recuperate and build strength and endurance before flying back to her home in [place]. PT will continue to work with pt acutely tomorrow for training with RW and for stair training before d/c.      Follow Up Recommendations Supervision for mobility/OOB    Equipment Recommendations  Rolling walker with 5" wheels    Recommendations for Other Services       Precautions / Restrictions Precautions Precautions: Fall Restrictions Weight Bearing Restrictions: No      Mobility  Bed Mobility Overal bed mobility: Modified Independent             General bed mobility comments: HoB elevated and use of handrails to come to EoB  Transfers Overall transfer level: Needs assistance Equipment used: None Transfers: Sit to/from Stand Sit to Stand: Supervision         General transfer comment: supervision for safety, good power up and  steadying  Ambulation/Gait Ambulation/Gait assistance: Min assist;Min guard Ambulation Distance (Feet): 200 Feet Assistive device: None Gait Pattern/deviations: Step-through pattern;Decreased stride length;Staggering left;Staggering right Gait velocity: slowed Gait velocity interpretation: <1.8 ft/sec, indicate of risk for recurrent falls General Gait Details: min guard to minA for steadying, pt reaching out for handrails in hall way to steady with walking, steates she holds on to her daughter when walking longer distances        Balance Overall balance assessment: Needs assistance Sitting-balance support: No upper extremity supported;Feet supported Sitting balance-Leahy Scale: Good     Standing balance support: No upper extremity supported;During functional activity Standing balance-Leahy Scale: Fair                               Pertinent Vitals/Pain Pain Assessment: No/denies pain    Home Living Family/patient expects to be discharged to:: Private residence Living Arrangements: Children Available Help at Discharge: Family;Available 24 hours/day Type of Home: House Home Access: Stairs to enter Entrance Stairs-Rails: None Entrance Stairs-Number of Steps: 5 Home Layout: Multi-level;Bed/bath upstairs Home Equipment: None      Prior Function Level of Independence: Independent               Hand Dominance        Extremity/Trunk Assessment   Upper Extremity Assessment Upper Extremity Assessment: Overall WFL for tasks assessed    Lower Extremity Assessment Lower Extremity Assessment: Overall WFL for tasks assessed       Communication   Communication: No difficulties  Cognition  Arousal/Alertness: Awake/alert Behavior During Therapy: WFL for tasks assessed/performed Overall Cognitive Status: Within Functional Limits for tasks assessed                                        General Comments General comments (skin integrity,  edema, etc.): Pt on 0.5 L O2 via nasal cannula on entry, and pt SoB, SaO2 93%O2, pt reports she was having some anxiety and that is possibly why she was short of breath, with ambulation SaO2 dropped to 87%O2, with 2/4 DoE, with standing rest break O2 rebounded to 92%O2, with sitting in chair after ambulation SaO2 96%O2        Assessment/Plan    PT Assessment Patient needs continued PT services  PT Problem List Cardiopulmonary status limiting activity;Decreased balance;Decreased mobility       PT Treatment Interventions DME instruction;Gait training;Stair training;Functional mobility training;Therapeutic activities;Therapeutic exercise;Balance training;Cognitive remediation;Patient/family education    PT Goals (Current goals can be found in the Care Plan section)  Acute Rehab PT Goals Patient Stated Goal: feel better PT Goal Formulation: With patient Time For Goal Achievement: 09/25/17 Potential to Achieve Goals: Good    Frequency Min 3X/week    AM-PAC PT "6 Clicks" Daily Activity  Outcome Measure Difficulty turning over in bed (including adjusting bedclothes, sheets and blankets)?: A Little Difficulty moving from lying on back to sitting on the side of the bed? : A Little Difficulty sitting down on and standing up from a chair with arms (e.g., wheelchair, bedside commode, etc,.)?: A Little Help needed moving to and from a bed to chair (including a wheelchair)?: A Little Help needed walking in hospital room?: A Little Help needed climbing 3-5 steps with a railing? : A Lot 6 Click Score: 17    End of Session Equipment Utilized During Treatment: Gait belt;Oxygen Activity Tolerance: Patient tolerated treatment well Patient left: in chair;with call bell/phone within reach;with chair alarm set Nurse Communication: Mobility status PT Visit Diagnosis: Unsteadiness on feet (R26.81);Other abnormalities of gait and mobility (R26.89);Difficulty in walking, not elsewhere classified  (R26.2)    Time: 4098-1191 PT Time Calculation (min) (ACUTE ONLY): 34 min   Charges:   PT Evaluation $PT Eval Moderate Complexity: 1 Mod PT Treatments $Gait Training: 8-22 mins   PT G Codes:        Emerlyn Mehlhoff B. Beverely Risen PT, DPT Acute Rehabilitation  [phone] Pager [phone]    Elon Alas Fleet 09/11/2017, 3:01 PM

## 2017-09-11 NOTE — Progress Notes (Signed)
PROGRESS NOTE    Debbie Parker  WUJ:[id number] DOB: December 06, 1929 DOA: 09/09/2017 PCP: System, Provider Not In   Brief Narrative:  84 year old female with history of atrial fibrillation on Eliquis, CAD status post CABG in 2010, hypertension, hyperlipidemia came to the hospital with complaints of progressive shortness of breath over 4 days.  Initially due to patient's recent long distance travel she had a CT of the chest done which was negative for pulmonary embolism but showed signs of fluid overload.  She was started on IV Lasix for diuresis.   Assessment & Plan:   Principal Problem:   New onset of congestive heart failure (HCC) Active Problems:   A-fib (HCC)   Acute respiratory failure with hypoxia (HCC)  Acute respiratory distress with hypoxia Fluid overload with dilated cardiomyopathy - Status post 2 doses of IV Lasix yesterday, will give her 2 more doses of IV Lasix again -Echocardiogram yesterday shows ejection fraction 60 to 65% without any wall motion abnormality.  Shows dilated cardiomyopathy - Fluid restriction 1800 cc, closely monitor input output -Wean off oxygen as necessary.  Closely monitor electrolytes Abnormal breath sounds with diffuse wheezing - Likely a component of fluid overload but also is diffuse in nature so could be from slight bronchospasm -We will give her a short burst of steroids, nebulizer treatments with Xopenex.  History of paroxysmal atrial fibrillation -Patient is on Toprol-XL 75 mg daily, Eliquis 5 mg twice daily.  Aspirin 81 mg  Coronary artery disease status post CABG in 2010 Hyperlipidemia -Continue aspirin, statin, beta-blocker  DVT prophylaxis: On Eliquis Code Status: Full code Family Communication: None at bedside Disposition Plan: Maintain inpatient stay for another 24-48 hours  Consultants:   None  Procedures:   None  Antimicrobials:   None   Subjective: Patient states she feels a little better this morning, no other  complaints besides mild exertional shortness of breath.  Lower extremity edema has improved.  Review of Systems Otherwise negative except as per HPI, including: General: Denies fever, chills, night sweats or unintended weight loss. Resp: Denies cough, wheezing Cardiac: Denies chest pain, palpitations, orthopnea, paroxysmal nocturnal dyspnea. GI: Denies abdominal pain, nausea, vomiting, diarrhea or constipation GU: Denies dysuria, frequency, hesitancy or incontinence MS: Denies muscle aches, joint pain or swelling Neuro: Denies headache, neurologic deficits (focal weakness, numbness, tingling), abnormal gait Psych: Denies anxiety, depression, SI/HI/AVH Skin: Denies new rashes or lesions ID: Denies sick contacts, exotic exposures, travel  Objective: Vitals:   09/10/17 1738 09/11/17 0025 09/11/17 0127 09/11/17 1004  BP: 108/75 100/68  104/77  Pulse: (!) 107 (!) 135  77  Resp: 20 (!) 22  (!) 24  Temp: 98.1 F (36.7 C) 97.9 F (36.6 C)  97.8 F (36.6 C)  TempSrc: Oral Oral  Oral  SpO2: 95% 92%  96%  Weight: 79.5 kg (175 lb 4.3 oz)  79.6 kg (175 lb 7.8 oz)   Height:  (1.6 m)       Intake/Output Summary (Last 24 hours) at 09/11/2017 1122 Last data filed at 09/10/2017 2030 Gross per 24 hour  Intake 103 ml  Output 1850 ml  Net -1747 ml   Filed Weights   09/10/17 1738 09/11/17 0127  Weight: 79.5 kg (175 lb 4.3 oz) 79.6 kg (175 lb 7.8 oz)    Examination:  General exam: Appears calm and comfortable, elderly frail-appearing.  Some facial ecchymosis from fall several weeks ago. Respiratory system: Diffuse expiratory wheezing Cardiovascular system: S1 & S2 heard, RRR. No JVD, murmurs, rubs, gallops or  clicks.  Trace pedal edema. Gastrointestinal system: Abdomen is nondistended, soft and nontender. No organomegaly or masses felt. Normal bowel sounds heard. Central nervous system: Alert and oriented. No focal neurological deficits. Extremities: Symmetric 5 x 5 power. Skin: No  rashes, lesions or ulcers Psychiatry: Judgement and insight appear normal. Mood & affect appropriate.     Data Reviewed:   CBC: Recent Labs  Lab 09/10/17 0001 09/10/17 0006  WBC 8.7  --   NEUTROABS 6.9  --   HGB 10.6* 11.2*  HCT 33.5* 33.0*  MCV 84.4  --   PLT 241  --    Basic Metabolic Panel: Recent Labs  Lab 09/10/17 0006 09/11/17 0243  NA 131* 138  K 3.7 3.2*  CL 97* 98*  CO2  --  27  GLUCOSE 132* 128*  BUN 20 21*  CREATININE 0.90 0.96  CALCIUM  --  8.8*  MG  --  1.8   GFR: Estimated Creatinine Clearance: 42.8 mL/min (by C-G formula based on SCr of 0.96 mg/dL). Liver Function Tests: No results for input(s): AST, ALT, ALKPHOS, BILITOT, PROT, ALBUMIN in the last 168 hours. No results for input(s): LIPASE, AMYLASE in the last 168 hours. No results for input(s): AMMONIA in the last 168 hours. Coagulation Profile: No results for input(s): INR, PROTIME in the last 168 hours. Cardiac Enzymes: No results for input(s): CKTOTAL, CKMB, CKMBINDEX, TROPONINI in the last 168 hours. BNP (last 3 results) No results for input(s): PROBNP in the last 8760 hours. HbA1C: No results for input(s): HGBA1C in the last 72 hours. CBG: Recent Labs  Lab 09/10/17 2150 09/11/17 0812  GLUCAP 153* 118*   Lipid Profile: No results for input(s): CHOL, HDL, LDLCALC, TRIG, CHOLHDL, LDLDIRECT in the last 72 hours. Thyroid Function Tests: No results for input(s): TSH, T4TOTAL, FREET4, T3FREE, THYROIDAB in the last 72 hours. Anemia Panel: No results for input(s): VITAMINB12, FOLATE, FERRITIN, TIBC, IRON, RETICCTPCT in the last 72 hours. Sepsis Labs: No results for input(s): PROCALCITON, LATICACIDVEN in the last 168 hours.  No results found for this or any previous visit (from the past 240 hour(s)).       Radiology Studies: Ct Angio Chest Pe W And/or Wo Contrast  Result Date: 09/10/2017 CLINICAL DATA:  84 y/o  F; 1 week of shortness of breath. EXAM: CT ANGIOGRAPHY CHEST WITH  CONTRAST TECHNIQUE: Multidetector CT imaging of the chest was performed using the standard protocol during bolus administration of intravenous contrast. Multiplanar CT image reconstructions and MIPs were obtained to evaluate the vascular anatomy. CONTRAST:  65mL ISOVUE-370 IOPAMIDOL (ISOVUE-370) INJECTION 76% COMPARISON:  09/09/2017 chest radiograph. FINDINGS: Cardiovascular: 4 cm ascending aorta. Moderate calcific atherosclerosis of aorta. Status post CABG with LIMA and saphenous grafts. Cardiomegaly with severe biatrial enlargement. Coronary artery and aortic valvular calcific atherosclerosis. Respiratory motion artifact. Satisfactory opacification of pulmonary arteries. No pulmonary embolus identified. Mediastinum/Nodes: Thyroid isthmus nodule measuring 4.4 x 3.7 cm. No mediastinal adenopathy. Small hiatal hernia. Lungs/Pleura: Small right larger than left pleural effusions. Smooth interlobular septal thickening. Patchy dependent opacities in the lower lobes. Upper Abdomen: No acute abnormality. Musculoskeletal: No chest wall abnormality. No acute or significant osseous findings. Review of the MIP images confirms the above findings. IMPRESSION: 1. No pulmonary embolus identified. 2. Interstitial pulmonary edema and small bilateral pleural effusions. Mild dependent atelectasis in the lower lobes. 3. 4 cm ascending aortic aneurysm. Recommend annual imaging followup by CTA or MRA. This recommendation follows 2010 ACCF/AHA/AATS/ACR/ASA/SCA/SCAI/SIR/STS/SVM Guidelines for the Diagnosis and Management of Patients with Thoracic  Aortic Disease. Circulation. 2010; 121: E454-U981. 4. Cardiomegaly with severe biatrial enlargement. 5. Coronary artery, aortic, and aortic valvular calcific atherosclerosis. 6. Thyroid isthmus nodule measuring up to 4.4 cm. Thyroid ultrasound is recommended on a nonemergent basis. 7. Small hiatal hernia. Electronically Signed   By: Mitzi Hansen M.D.   On: 09/10/2017 01:18   Dg  Chest Port 1 View  Result Date: 09/10/2017 CLINICAL DATA:  Acute onset of shortness of breath. Facial swelling. Wheezing. EXAM: PORTABLE CHEST 1 VIEW COMPARISON:  None. FINDINGS: The lungs are well-aerated. Small bilateral pleural effusions are noted. Increased interstitial markings may reflect mild interstitial edema. No pneumothorax is seen. The cardiomediastinal silhouette is mildly enlarged. The patient is status post median sternotomy. No acute osseous abnormalities are seen. IMPRESSION: Small bilateral pleural effusions noted. Increased interstitial markings may reflect mild interstitial edema. Mild cardiomegaly. Electronically Signed   By: Roanna Raider M.D.   On: 09/10/2017 00:03        Scheduled Meds: . apixaban  5 mg Oral BID  . aspirin EC  81 mg Oral Daily  . metoprolol succinate  75 mg Oral Daily  . rosuvastatin  20 mg Oral Daily  . sodium chloride flush  3 mL Intravenous Q12H   Continuous Infusions: . sodium chloride       LOS: 1 day    I have spent 35 minutes face to face with the patient and on the ward discussing the patients care, assessment, plan and disposition with other care givers. >50% of the time was devoted counseling the patient about the risks and benefits of treatment and coordinating care.     Taylan Mayhan Joline Maxcy, MD [facility] Pager [phone]   If 7PM-7AM, please contact night-coverage [email or web address] Password [email or web address] 09/11/2017, 11:22 AM

## 2017-09-12 LAB — BASIC METABOLIC PANEL
Anion gap: 7 (ref 5–15)
BUN: 26 mg/dL — ABNORMAL HIGH (ref 6–20)
CALCIUM: 8.5 mg/dL — AB (ref 8.9–10.3)
CO2: 28 mmol/L (ref 22–32)
Chloride: 102 mmol/L (ref 101–111)
Creatinine, Ser: 0.94 mg/dL (ref 0.44–1.00)
GFR, EST NON AFRICAN AMERICAN: 54 mL/min — AB (ref >60)
Glucose, Bld: 137 mg/dL — ABNORMAL HIGH (ref 65–99)
Potassium: 4.1 mmol/L (ref 3.5–5.1)
SODIUM: 137 mmol/L (ref 135–145)

## 2017-09-12 LAB — GLUCOSE, CAPILLARY
GLUCOSE-CAPILLARY: 125 mg/dL — AB (ref 65–99)
Glucose-Capillary: 119 mg/dL — ABNORMAL HIGH (ref 65–99)
Glucose-Capillary: 532 mg/dL (ref 65–99)
Glucose-Capillary: 163 mg/dL — ABNORMAL HIGH (ref 65–99)

## 2017-09-12 LAB — MAGNESIUM: MAGNESIUM: 2 mg/dL (ref 1.7–2.4)

## 2017-09-12 MED ORDER — INSULIN ASPART 100 UNIT/ML ~~LOC~~ SOLN: 0.0000 [IU] | Freq: Every day | SUBCUTANEOUS | Status: DC

## 2017-09-12 MED ORDER — INSULIN ASPART 100 UNIT/ML ~~LOC~~ SOLN
0.0000 [IU] | Freq: Three times a day (TID) | SUBCUTANEOUS | Status: DC
  Administered 2017-09-13: 2 [IU] via SUBCUTANEOUS
  Administered 2017-09-13: 3 [IU] via SUBCUTANEOUS
  Administered 2017-09-14: 2 [IU] via SUBCUTANEOUS

## 2017-09-12 MED ORDER — INSULIN ASPART 100 UNIT/ML ~~LOC~~ SOLN: 12.0000 [IU] | Freq: Once | SUBCUTANEOUS | Status: DC

## 2017-09-12 MED ORDER — LEVALBUTEROL HCL 0.63 MG/3ML IN NEBU: 0.6300 mg | INHALATION_SOLUTION | Freq: Four times a day (QID) | RESPIRATORY_TRACT | Status: DC | PRN

## 2017-09-12 MED ORDER — LEVALBUTEROL HCL 0.63 MG/3ML IN NEBU
0.6300 mg | INHALATION_SOLUTION | Freq: Two times a day (BID) | RESPIRATORY_TRACT | Status: DC
  Administered 2017-09-12: 0.63 mg via RESPIRATORY_TRACT
  Filled 2017-09-12: qty 3

## 2017-09-12 MED ORDER — FUROSEMIDE 10 MG/ML IJ SOLN
40.0000 mg | Freq: Three times a day (TID) | INTRAMUSCULAR | Status: AC
  Administered 2017-09-12: 40 mg via INTRAVENOUS
  Filled 2017-09-12 (×2): qty 4

## 2017-09-12 NOTE — Progress Notes (Signed)
PROGRESS NOTE    Debbie Parker  ZOX:[id number] DOB: December 15, 1929 DOA: 09/09/2017 PCP: System, Provider Not In   Brief Narrative:  84 year old female with history of atrial fibrillation on Eliquis, CAD status post CABG in 2010, hypertension, hyperlipidemia came to the hospital with complaints of progressive shortness of breath over 4 days.  Initially due to patient's recent long distance travel she had a CT of the chest done which was negative for pulmonary embolism but showed signs of fluid overload.  She was started on IV Lasix for diuresis.   Assessment & Plan:   Principal Problem:   New onset of congestive heart failure (HCC) Active Problems:   A-fib (HCC)   Acute respiratory failure with hypoxia (HCC)   Bronchospasm with bronchitis, acute  Acute respiratory distress with hypoxia Fluid overload with dilated cardiomyopathy -Will continue IV lasix BID as she has making improvement with it. Reassess patient tomorrow for more needs.  -Echocardiogram yesterday shows ejection fraction 60 to 65% without any wall motion abnormality.  Shows dilated cardiomyopathy - Continue fluid restriction 1800 cc daily, closely monitor urine output -We will need ambulatory pulse ox prior to discharge to ensure she is saturating well. -Wean off oxygen as necessary.  Closely monitor electrolytes  Abnormal breath sounds with diffuse wheezing; improved.  - Likely a component of fluid overload but also is diffuse in nature so could be from slight bronchospasm. Cont Prednisone, Neb treatments with Xopenex.   History of paroxysmal atrial fibrillation -Patient is on Toprol-XL 75 mg daily, Eliquis 5 mg twice daily.  Aspirin 81 mg  Coronary artery disease status post CABG in 2010 Hyperlipidemia -Continue aspirin, statin, beta-blocker  DVT prophylaxis: On Eliquis Code Status: Full code Family Communication: Spoke with the patient daughter over the phone.  Disposition Plan: Maintain inpatient stay for another  24-48 hours  Consultants:   None  Procedures:   None  Antimicrobials:   None   Subjective: Patient states she feels a little better this morning but does have mild exertional dyspnea.  Review of Systems Otherwise negative except as per HPI, including: General = no fevers, chills, dizziness, malaise, fatigue HEENT/EYES = negative for pain, redness, loss of vision, double vision, blurred vision, loss of hearing, sore throat, hoarseness, dysphagia Cardiovascular= negative for chest pain, palpitation, murmurs, lower extremity swelling Respiratory/lungs= negative for cough, hemoptysis, wheezing, mucus production Gastrointestinal= negative for nausea, vomiting,, abdominal pain, melena, hematemesis Genitourinary= negative for Dysuria, Hematuria, Change in Urinary Frequency MSK = Negative for arthralgia, myalgias, Back Pain, Joint swelling  Neurology= Negative for headache, seizures, numbness, tingling  Psychiatry= Negative for anxiety, depression, suicidal and homocidal ideation Allergy/Immunology= Medication/Food allergy as listed  Skin= Negative for Rash, lesions, ulcers, itching   Objective: Vitals:   09/12/17 0016 09/12/17 0144 09/12/17 0737 09/12/17 0829  BP: 111/82   128/82  Pulse: 86   79  Resp:    14  Temp: 98.6 F (37 C)   98.5 F (36.9 C)  TempSrc: Oral   Oral  SpO2: 95%  98% 100%  Weight:  79.6 kg (175 lb 7.8 oz)    Height:        Intake/Output Summary (Last 24 hours) at 09/12/2017 1053 Last data filed at 09/12/2017 0643 Gross per 24 hour  Intake 243 ml  Output 1200 ml  Net -957 ml   Filed Weights   09/10/17 1738 09/11/17 0127 09/12/17 0144  Weight: 79.5 kg (175 lb 4.3 oz) 79.6 kg (175 lb 7.8 oz) 79.6 kg (175 lb 7.8 oz)  Examination: Constitutional: NAD, calm, comfortable, elderly frail-appearing Eyes: PERRL, lids and conjunctivae normal, left-sided facial ecchymosis which is greatly improved. ENMT: Mucous membranes are moist. Posterior pharynx clear  of any exudate or lesions.Normal dentition.  Neck: normal, supple, no masses, no thyromegaly Respiratory: Mild bilateral diffuse expiratory wheezing with bibasilar crackles Cardiovascular: Regular rate and rhythm, no murmurs / rubs / gallops. No extremity edema. 2+ pedal pulses. No carotid bruits.  Abdomen: no tenderness, no masses palpated. No hepatosplenomegaly. Bowel sounds positive.  Musculoskeletal: no clubbing / cyanosis. No joint deformity upper and lower extremities. Good ROM, no contractures. Normal muscle tone.  Skin: no rashes, lesions, ulcers. No induration Neurologic: CN 2-12 grossly intact. Sensation intact, DTR normal. Strength 5/5 in all 4.  Psychiatric: Normal judgment and insight. Alert and oriented x 3. Normal mood.  \    Data Reviewed:   CBC: Recent Labs  Lab 09/10/17 0001 09/10/17 0006  WBC 8.7  --   NEUTROABS 6.9  --   HGB 10.6* 11.2*  HCT 33.5* 33.0*  MCV 84.4  --   PLT 241  --    Basic Metabolic Panel: Recent Labs  Lab 09/10/17 0006 09/11/17 0243 09/12/17 0327  NA 131* 138 137  K 3.7 3.2* 4.1  CL 97* 98* 102  CO2  --  27 28  GLUCOSE 132* 128* 137*  BUN 20 21* 26*  CREATININE 0.90 0.96 0.94  CALCIUM  --  8.8* 8.5*  MG  --  1.8 2.0   GFR: Estimated Creatinine Clearance: 43.7 mL/min (by C-G formula based on SCr of 0.94 mg/dL). Liver Function Tests: No results for input(s): AST, ALT, ALKPHOS, BILITOT, PROT, ALBUMIN in the last 168 hours. No results for input(s): LIPASE, AMYLASE in the last 168 hours. No results for input(s): AMMONIA in the last 168 hours. Coagulation Profile: No results for input(s): INR, PROTIME in the last 168 hours. Cardiac Enzymes: No results for input(s): CKTOTAL, CKMB, CKMBINDEX, TROPONINI in the last 168 hours. BNP (last 3 results) No results for input(s): PROBNP in the last 8760 hours. HbA1C: No results for input(s): HGBA1C in the last 72 hours. CBG: Recent Labs  Lab 09/11/17 0812 09/11/17 1224 09/11/17 1635  09/11/17 2132 09/12/17 0831  GLUCAP 118* 108* 130* 171* 125*   Lipid Profile: No results for input(s): CHOL, HDL, LDLCALC, TRIG, CHOLHDL, LDLDIRECT in the last 72 hours. Thyroid Function Tests: No results for input(s): TSH, T4TOTAL, FREET4, T3FREE, THYROIDAB in the last 72 hours. Anemia Panel: No results for input(s): VITAMINB12, FOLATE, FERRITIN, TIBC, IRON, RETICCTPCT in the last 72 hours. Sepsis Labs: No results for input(s): PROCALCITON, LATICACIDVEN in the last 168 hours.  No results found for this or any previous visit (from the past 240 hour(s)).       Radiology Studies: No results found.      Scheduled Meds: . apixaban  5 mg Oral BID  . aspirin EC  81 mg Oral Daily  . furosemide  40 mg Intravenous BID  . levalbuterol  0.63 mg Nebulization TID  . metoprolol succinate  75 mg Oral Daily  . predniSONE  40 mg Oral Q breakfast  . rosuvastatin  20 mg Oral Daily  . sodium chloride flush  3 mL Intravenous Q12H   Continuous Infusions: . sodium chloride       LOS: 2 days    I have spent 25 minutes face to face with the patient and on the ward discussing the patients care, assessment, plan and disposition with other care givers. >  50% of the time was devoted counseling the patient about the risks and benefits of treatment and coordinating care.     Brandelyn Henne Joline Maxcy, MD [facility] Pager [phone]   If 7PM-7AM, please contact night-coverage [email or web address] Password [email or web address] [place] 09/12/2017, 10:53 AM

## 2017-09-12 NOTE — Care Management Note (Addendum)
Case Management Note  Patient Details  Name: Debbie SanderJudith Parker MRN: [id number] Date of Birth: [birth year]  Subjective/Objective:    From [place], here visiting daughter, new onset chf, pt recs for patient to dc to daughter home to recupedrate and build strength before flying back to [place], she is on iv lasix, plan to dc  Over weekend will need rolling walker prior to dc. She will be going with daughter and daughter states they will be traveling so she will just need the rolling walker.                Action/Plan: DC home when ready with rolling walker.  Expected Discharge Date:                  Expected Discharge Plan:  Home/Self Care  In-House Referral:     Discharge planning Services  CM Consult  Post Acute Care Choice:    Choice offered to:     DME Arranged:    DME Agency:     HH Arranged:    HH Agency:     Status of Service:  In process, will continue to follow  If discussed at Long Length of Stay Meetings, dates discussed:    Additional Comments:  Leone Havenaylor, Shlome Baldree Clinton, RN 09/12/2017, 3:20 PM

## 2017-09-12 NOTE — Progress Notes (Signed)
Physical Therapy Treatment Patient Details Name: Debbie Parker MRN: [id number] DOB: 08/13/1929 Today's Date: 09/12/2017    History of Present Illness 84 year old female with history of atrial fibrillation on Eliquis, CAD status post CABG in 2010, hypertension, hyperlipidemia came to the hospital with complaints of progressive shortness of breath over 4 days.  Initially due to patient's recent long distance travel she had a CT of the chest done which was negative for pulmonary embolism but showed signs of fluid overload.  She was started on IV Lasix for diuresis.    PT Comments    Pt is progressing well towards mobility goals. Today's session focused on gait training with RW and stair training. Session performed on RA with SpO2 remaining >94% throughout. Pt would benefit from continued acute PT to address balance deficits and increase safety with mobility. Will continue to follow.     Follow Up Recommendations  Supervision for mobility/OOB     Equipment Recommendations  Rolling walker with 5" wheels    Recommendations for Other Services       Precautions / Restrictions Precautions Precautions: Fall Restrictions Weight Bearing Restrictions: No    Mobility  Bed Mobility Overal bed mobility: Modified Independent             General bed mobility comments: HoB elevated and use of handrails to come to EoB  Transfers Overall transfer level: Needs assistance Equipment used: None Transfers: Sit to/from Stand Sit to Stand: Supervision         General transfer comment: supervision for safety, good power up and steadying  Ambulation/Gait Ambulation/Gait assistance: Min guard Ambulation Distance (Feet): 500 Feet Assistive device: Rolling walker (2 wheeled) Gait Pattern/deviations: Step-through pattern;Decreased stride length Gait velocity: slowed   General Gait Details: Hands on min guard for safety. Pt with 1 LOB before leaving room, no assist required to steady. Once in  hall pt appeared steady with RW. Pt ambulated on RA with SpO2 remaining >94%. Two standing rest breaks during gait and cues for pursed lipped breathing.   Stairs Stairs: Yes Stairs assistance: Min guard Stair Management: One rail Right;Alternating pattern;Step to pattern;Forwards   General stair comments: Pt able to ascend steps with reciprocal pattern and two hands on hand rail. She descended with step too pattern. Pt will need to ascend 5 steps w/o hand rails to enter daughters home. Attempted to provide stair training backwards with RW, however pt resistant to this technique. Talked through bumping up steps instead.   Wheelchair Mobility    Modified Rankin (Stroke Patients Only)       Balance Overall balance assessment: Needs assistance Sitting-balance support: No upper extremity supported;Feet supported Sitting balance-Leahy Scale: Good     Standing balance support: No upper extremity supported;During functional activity Standing balance-Leahy Scale: Fair                              Cognition Arousal/Alertness: Awake/alert Behavior During Therapy: WFL for tasks assessed/performed Overall Cognitive Status: Within Functional Limits for tasks assessed                                        Exercises      General Comments General comments (skin integrity, edema, etc.): Edcuated on pursed lipped breathing technique when pt feels OOB      Pertinent Vitals/Pain Pain Assessment: No/denies pain    Home  Living                      Prior Function            PT Goals (current goals can now be found in the care plan section) Acute Rehab PT Goals Patient Stated Goal: feel better PT Goal Formulation: With patient Time For Goal Achievement: 09/25/17 Potential to Achieve Goals: Good Progress towards PT goals: Progressing toward goals    Frequency    Min 3X/week      PT Plan Current plan remains appropriate     Co-evaluation              AM-PAC PT "6 Clicks" Daily Activity  Outcome Measure  Difficulty turning over in bed (including adjusting bedclothes, sheets and blankets)?: None Difficulty moving from lying on back to sitting on the side of the bed? : None Difficulty sitting down on and standing up from a chair with arms (e.g., wheelchair, bedside commode, etc,.)?: A Little Help needed moving to and from a bed to chair (including a wheelchair)?: A Little Help needed walking in hospital room?: A Little Help needed climbing 3-5 steps with a railing? : A Little 6 Click Score: 20    End of Session Equipment Utilized During Treatment: Gait belt Activity Tolerance: Patient tolerated treatment well Patient left: in chair;with call bell/phone within reach Nurse Communication: Mobility status(o2 sats on RA) PT Visit Diagnosis: Unsteadiness on feet (R26.81);Other abnormalities of gait and mobility (R26.89);Difficulty in walking, not elsewhere classified (R26.2)     Time: 1610-9604 PT Time Calculation (min) (ACUTE ONLY): 27 min  Charges:  $Gait Training: 23-37 mins                    G Codes:      Debbie Parker, [place] Pager [phone] Acute Rehab  Debbie Parker 09/12/2017, 2:43 PM

## 2017-09-12 NOTE — Care Management Important Message (Signed)
Important Message  Patient Details  Name: Debbie Parker MRN: [id number] Date of Birth: [birth year]   Medicare Important Message Given:  Yes    [name] 09/12/2017, 3:15 PM

## 2017-09-13 DIAGNOSIS — J9601 Acute respiratory failure with hypoxia: Secondary | ICD-10-CM

## 2017-09-13 DIAGNOSIS — I509 Heart failure, unspecified: Secondary | ICD-10-CM

## 2017-09-13 DIAGNOSIS — J810 Acute pulmonary edema: Secondary | ICD-10-CM

## 2017-09-13 DIAGNOSIS — I4891 Unspecified atrial fibrillation: Secondary | ICD-10-CM

## 2017-09-13 LAB — BASIC METABOLIC PANEL
Anion gap: 8 (ref 5–15)
BUN: 23 mg/dL — AB (ref 6–20)
CALCIUM: 9 mg/dL (ref 8.9–10.3)
CO2: 31 mmol/L (ref 22–32)
CREATININE: 0.92 mg/dL (ref 0.44–1.00)
Chloride: 98 mmol/L — ABNORMAL LOW (ref 101–111)
GFR calc non Af Amer: 55 mL/min — ABNORMAL LOW (ref >60)
GLUCOSE: 105 mg/dL — AB (ref 65–99)
Potassium: 3.7 mmol/L (ref 3.5–5.1)
Sodium: 137 mmol/L (ref 135–145)

## 2017-09-13 LAB — MAGNESIUM: Magnesium: 2.3 mg/dL (ref 1.7–2.4)

## 2017-09-13 LAB — GLUCOSE, CAPILLARY
GLUCOSE-CAPILLARY: 92 mg/dL (ref 65–99)
GLUCOSE-CAPILLARY: 132 mg/dL — AB (ref 65–99)
GLUCOSE-CAPILLARY: 151 mg/dL — AB (ref 65–99)
GLUCOSE-CAPILLARY: 107 mg/dL — AB (ref 65–99)

## 2017-09-13 MED ORDER — FUROSEMIDE 10 MG/ML IJ SOLN
20.0000 mg | Freq: Two times a day (BID) | INTRAMUSCULAR | Status: AC
  Administered 2017-09-13 (×2): 20 mg via INTRAVENOUS
  Filled 2017-09-13 (×2): qty 2

## 2017-09-13 MED ORDER — POTASSIUM CHLORIDE CRYS ER 20 MEQ PO TBCR
40.0000 meq | EXTENDED_RELEASE_TABLET | Freq: Two times a day (BID) | ORAL | Status: AC
  Administered 2017-09-13 (×2): 40 meq via ORAL
  Filled 2017-09-13 (×2): qty 2

## 2017-09-13 NOTE — Progress Notes (Signed)
PROGRESS NOTE    Debbie Parker  MVH:[id number] DOB: 08-Jun-1929 DOA: 09/09/2017 PCP: System, Provider Not In   Brief Narrative:  84 year old female with history of atrial fibrillation on Eliquis, CAD status post CABG in 2010, hypertension, hyperlipidemia came to the hospital with complaints of progressive shortness of breath over 4 days.  Initially due to patient's recent long distance travel she had a CT of the chest done which was negative for pulmonary embolism but showed signs of fluid overload.  She was started on IV Lasix for diuresis.   Assessment & Plan:    Acute hypoxic respiratory failure secondary to CHF -Dilated cardiomyopathy with moderate aortic stenosis -Clinically improving with diuresis echo with preserved ejection fraction -She has -4.9 L, continue IV Lasix today -Wean off O2, monitor urine output -DC home on oral diuretics in the next 1-2 days -wheezing noted early this is admission likely related to CHF, will stop prednisone  History of paroxysmal atrial fibrillation -Patient is on Toprol-XL 75 mg daily, Eliquis 5 mg twice daily.  Aspirin 81 mg  Coronary artery disease status post CABG in 2010 Hyperlipidemia -Continue aspirin, statin, beta-blocker  Hyperglycemia -Likely from steroids, follow-up hemoglobin A1c  DVT prophylaxis: On Eliquis Code Status: Full code Family Communication: no family at bedside  Disposition Plan: home in 1-2 days  Consultants:   None  Procedures:   None  Antimicrobials:   None   Subjective: -breathing improving, not back to baseline yet   Objective: Vitals:   09/12/17 0829 09/12/17 1951 09/12/17 2256 09/13/17 0820  BP: 128/82  132/81 123/83  Pulse: 79  87 (!) 109  Resp: 14   16  Temp: 98.5 F (36.9 C)  97.8 F (36.6 C)   TempSrc: Oral  Oral   SpO2: 100% 100% 92% 97%  Weight:      Height:        Intake/Output Summary (Last 24 hours) at 09/13/2017 1106 Last data filed at 09/13/2017 0950 Gross per 24 hour    Intake 600 ml  Output 1400 ml  Net -800 ml   Filed Weights   09/10/17 1738 09/11/17 0127 09/12/17 0144  Weight: 79.5 kg (175 lb 4.3 oz) 79.6 kg (175 lb 7.8 oz) 79.6 kg (175 lb 7.8 oz)    Examination: Gen: Awake, Alert, Oriented X 3,  HEENT: PERRLA, Neck supple, + JVD Lungs: bibasilar crackles CVS: RRR,No Gallops,Rubs or new Murmurs Abd: soft, Non tender, non distended, BS present Extremities: trace edema Skin: no new rashes Psychiatric: Normal judgment and insight. Alert and oriented x 3. Normal mood.  \    Data Reviewed:   CBC: Recent Labs  Lab 09/10/17 0001 09/10/17 0006  WBC 8.7  --   NEUTROABS 6.9  --   HGB 10.6* 11.2*  HCT 33.5* 33.0*  MCV 84.4  --   PLT 241  --    Basic Metabolic Panel: Recent Labs  Lab 09/10/17 0006 09/11/17 0243 09/12/17 0327 09/13/17 0412  NA 131* 138 137 137  K 3.7 3.2* 4.1 3.7  CL 97* 98* 102 98*  CO2  --  27 28 31   GLUCOSE 132* 128* 137* 105*  BUN 20 21* 26* 23*  CREATININE 0.90 0.96 0.94 0.92  CALCIUM  --  8.8* 8.5* 9.0  MG  --  1.8 2.0 2.3   GFR: Estimated Creatinine Clearance: 44.7 mL/min (by C-G formula based on SCr of 0.92 mg/dL). Liver Function Tests: No results for input(s): AST, ALT, ALKPHOS, BILITOT, PROT, ALBUMIN in the last 168  hours. No results for input(s): LIPASE, AMYLASE in the last 168 hours. No results for input(s): AMMONIA in the last 168 hours. Coagulation Profile: No results for input(s): INR, PROTIME in the last 168 hours. Cardiac Enzymes: No results for input(s): CKTOTAL, CKMB, CKMBINDEX, TROPONINI in the last 168 hours. BNP (last 3 results) No results for input(s): PROBNP in the last 8760 hours. HbA1C: No results for input(s): HGBA1C in the last 72 hours. CBG: Recent Labs  Lab 09/12/17 0831 09/12/17 1152 09/12/17 1754 09/12/17 2151 09/13/17 0700  GLUCAP 125* 119* 532* 163* 92   Lipid Profile: No results for input(s): CHOL, HDL, LDLCALC, TRIG, CHOLHDL, LDLDIRECT in the last 72  hours. Thyroid Function Tests: No results for input(s): TSH, T4TOTAL, FREET4, T3FREE, THYROIDAB in the last 72 hours. Anemia Panel: No results for input(s): VITAMINB12, FOLATE, FERRITIN, TIBC, IRON, RETICCTPCT in the last 72 hours. Sepsis Labs: No results for input(s): PROCALCITON, LATICACIDVEN in the last 168 hours.  No results found for this or any previous visit (from the past 240 hour(s)).       Radiology Studies: No results found.      Scheduled Meds: . apixaban  5 mg Oral BID  . aspirin EC  81 mg Oral Daily  . furosemide  20 mg Intravenous Q12H  . insulin aspart  0-15 Units Subcutaneous TID WC  . insulin aspart  0-5 Units Subcutaneous QHS  . insulin aspart  12 Units Subcutaneous Once  . metoprolol succinate  75 mg Oral Daily  . potassium chloride  40 mEq Oral BID  . rosuvastatin  20 mg Oral Daily  . sodium chloride flush  3 mL Intravenous Q12H   Continuous Infusions: . sodium chloride       LOS: 3 days    Time spent: 25min   Zannie CovePreetha Jezelle Gullick, MD [facility] Page via [email or web address] password [email or web address]  If 7PM-7AM, please contact night-coverage  09/13/2017, 11:06 AM

## 2017-09-14 LAB — MAGNESIUM: Magnesium: 2.2 mg/dL (ref 1.7–2.4)

## 2017-09-14 LAB — BASIC METABOLIC PANEL
ANION GAP: 8 (ref 5–15)
BUN: 20 mg/dL (ref 6–20)
CO2: 30 mmol/L (ref 22–32)
Calcium: 9.1 mg/dL (ref 8.9–10.3)
Chloride: 101 mmol/L (ref 101–111)
Creatinine, Ser: 0.88 mg/dL (ref 0.44–1.00)
GFR calc non Af Amer: 58 mL/min — ABNORMAL LOW (ref >60)
Glucose, Bld: 97 mg/dL (ref 65–99)
POTASSIUM: 4.4 mmol/L (ref 3.5–5.1)
Sodium: 139 mmol/L (ref 135–145)

## 2017-09-14 LAB — CBC
HCT: 37.5 % (ref 36.0–46.0)
HEMOGLOBIN: 11.4 g/dL — AB (ref 12.0–15.0)
MCH: 26.3 pg (ref 26.0–34.0)
MCHC: 30.4 g/dL (ref 30.0–36.0)
MCV: 86.4 fL (ref 78.0–100.0)
PLATELETS: 293 10*3/uL (ref 150–400)
RBC: 4.34 MIL/uL (ref 3.87–5.11)
RDW: 14.8 % (ref 11.5–15.5)
WBC: 12 10*3/uL — AB (ref 4.0–10.5)

## 2017-09-14 LAB — GLUCOSE, CAPILLARY
GLUCOSE-CAPILLARY: 140 mg/dL — AB (ref 65–99)
GLUCOSE-CAPILLARY: 113 mg/dL — AB (ref 65–99)
GLUCOSE-CAPILLARY: 69 mg/dL (ref 65–99)
GLUCOSE-CAPILLARY: 97 mg/dL (ref 65–99)
Glucose-Capillary: 108 mg/dL — ABNORMAL HIGH (ref 65–99)

## 2017-09-14 LAB — HEMOGLOBIN A1C
Hgb A1c MFr Bld: 5.8 % — ABNORMAL HIGH (ref 4.8–5.6)
MEAN PLASMA GLUCOSE: 119.76 mg/dL

## 2017-09-14 MED ORDER — POTASSIUM CHLORIDE CRYS ER 20 MEQ PO TBCR
40.0000 meq | EXTENDED_RELEASE_TABLET | Freq: Every day | ORAL | Status: AC
Start: 2017-09-14 — End: 2017-09-14
  Administered 2017-09-14: 40 meq via ORAL
  Filled 2017-09-14: qty 2

## 2017-09-14 MED ORDER — FUROSEMIDE 40 MG PO TABS
40.0000 mg | ORAL_TABLET | Freq: Two times a day (BID) | ORAL | Status: DC
  Administered 2017-09-14 – 2017-09-15 (×3): 40 mg via ORAL
  Filled 2017-09-14 (×2): qty 1

## 2017-09-14 MED ORDER — GUAIFENESIN ER 600 MG PO TB12
600.0000 mg | ORAL_TABLET | Freq: Two times a day (BID) | ORAL | Status: DC
  Administered 2017-09-14 – 2017-09-15 (×3): 600 mg via ORAL
  Filled 2017-09-14 (×3): qty 1

## 2017-09-14 NOTE — Progress Notes (Signed)
PROGRESS NOTE    Debbie SanderJudith Parker  EAV:[id number] DOB: [birth year] DOA: 09/09/2017 PCP: System, Provider Not In   Brief Narrative:  84 year old female with history of atrial fibrillation on Eliquis, CAD status post CABG in 2010, hypertension, hyperlipidemia came to the hospital with complaints of progressive shortness of breath over 4 days.  Initially due to patient's recent long distance travel she had a CT of the chest done which was negative for pulmonary embolism but showed signs of fluid overload.  She was started on IV Lasix for diuresis.   Assessment & Plan:    Acute hypoxic respiratory failure secondary to CHF -Dilated cardiomyopathy with moderate aortic stenosis -Clinically improving with diuresis, 2D ECHO noted preserved ejection fraction -She has -5 L, appears close to euvolemic, change to oral Lasix today -Wean off O2 -ambulate, OOB  History of paroxysmal atrial fibrillation -Patient is on Toprol-XL 75 mg daily, Eliquis 5 mg twice daily.  Aspirin 81 mg -continue this regimen  Coronary artery disease status post CABG in 2010 -Continue aspirin, statin, beta-blocker  Hyperglycemia -Likely from steroids, -check hemoglobin A1c  DVT prophylaxis: On Eliquis Code Status: Full code Family Communication: no family at bedside  Disposition Plan: home tomorrow  Consultants:   None  Procedures:   None  Antimicrobials:   None   Subjective: -breathing improving, still has a cough   Objective: Vitals:   09/13/17 1813 09/14/17 0012 09/14/17 0435 09/14/17 0852  BP: 121/88 125/86  114/71  Pulse: 91 80  82  Resp: 16 (!) 24  (!) 24  Temp: (!) 97.5 F (36.4 C) 97.6 F (36.4 C)  98.2 F (36.8 C)  TempSrc: Oral Oral  Oral  SpO2: 98% 91%  95%  Weight:   79.5 kg (175 lb 4.3 oz)   Height:        Intake/Output Summary (Last 24 hours) at 09/14/2017 1117 Last data filed at 09/13/2017 2110 Gross per 24 hour  Intake 483 ml  Output 950 ml  Net -467 ml   Filed Weights   09/11/17 0127 09/12/17 0144 09/14/17 0435  Weight: 79.6 kg (175 lb 7.8 oz) 79.6 kg (175 lb 7.8 oz) 79.5 kg (175 lb 4.3 oz)    Examination:  Gen: Awake, Alert, Oriented X 3,  HEENT: PERRLA, Neck supple, no JVD Lungs: lower basilar crackles, improving CVS: RRR,No Gallops,Rubs or new Murmurs Abd: soft, Non tender, non distended, BS present Extremities: No edema Skin: no new rashes Psychiatric: Normal judgment and insight. Alert and oriented x 3. Normal mood.  \    Data Reviewed:   CBC: Recent Labs  Lab 09/10/17 0001 09/10/17 0006 09/14/17 0310  WBC 8.7  --  12.0*  NEUTROABS 6.9  --   --   HGB 10.6* 11.2* 11.4*  HCT 33.5* 33.0* 37.5  MCV 84.4  --  86.4  PLT 241  --  293   Basic Metabolic Panel: Recent Labs  Lab 09/10/17 0006 09/11/17 0243 09/12/17 0327 09/13/17 0412 09/14/17 0310  NA 131* 138 137 137 139  K 3.7 3.2* 4.1 3.7 4.4  CL 97* 98* 102 98* 101  CO2  --  27 28 31 30   GLUCOSE 132* 128* 137* 105* 97  BUN 20 21* 26* 23* 20  CREATININE 0.90 0.96 0.94 0.92 0.88  CALCIUM  --  8.8* 8.5* 9.0 9.1  MG  --  1.8 2.0 2.3 2.2   GFR: Estimated Creatinine Clearance: 46.6 mL/min (by C-G formula based on SCr of 0.88 mg/dL). Liver Function Tests: No  results for input(s): AST, ALT, ALKPHOS, BILITOT, PROT, ALBUMIN in the last 168 hours. No results for input(s): LIPASE, AMYLASE in the last 168 hours. No results for input(s): AMMONIA in the last 168 hours. Coagulation Profile: No results for input(s): INR, PROTIME in the last 168 hours. Cardiac Enzymes: No results for input(s): CKTOTAL, CKMB, CKMBINDEX, TROPONINI in the last 168 hours. BNP (last 3 results) No results for input(s): PROBNP in the last 8760 hours. HbA1C: No results for input(s): HGBA1C in the last 72 hours. CBG: Recent Labs  Lab 09/13/17 0700 09/13/17 1204 09/13/17 1733 09/13/17 2110 09/14/17 0825  GLUCAP 92 132* 151* 107* 140*   Lipid Profile: No results for input(s): CHOL, HDL, LDLCALC, TRIG,  CHOLHDL, LDLDIRECT in the last 72 hours. Thyroid Function Tests: No results for input(s): TSH, T4TOTAL, FREET4, T3FREE, THYROIDAB in the last 72 hours. Anemia Panel: No results for input(s): VITAMINB12, FOLATE, FERRITIN, TIBC, IRON, RETICCTPCT in the last 72 hours. Sepsis Labs: No results for input(s): PROCALCITON, LATICACIDVEN in the last 168 hours.  No results found for this or any previous visit (from the past 240 hour(s)).       Radiology Studies: No results found.      Scheduled Meds: . apixaban  5 mg Oral BID  . aspirin EC  81 mg Oral Daily  . furosemide  40 mg Oral BID  . guaiFENesin  600 mg Oral BID  . insulin aspart  0-15 Units Subcutaneous TID WC  . insulin aspart  0-5 Units Subcutaneous QHS  . insulin aspart  12 Units Subcutaneous Once  . metoprolol succinate  75 mg Oral Daily  . rosuvastatin  20 mg Oral Daily  . sodium chloride flush  3 mL Intravenous Q12H   Continuous Infusions: . sodium chloride       LOS: 4 days    Time spent:   Zannie Cove, MD [facility] Page via [email or web address] password [email or web address]  If 7PM-7AM, please contact night-coverage  09/14/2017, 11:17 AM

## 2017-09-14 NOTE — Progress Notes (Addendum)
Pt's blood sugar is 69. Pt is asymptomatic and eating her lunch. NT to recheck after she eats  MD notified via text page

## 2017-09-14 NOTE — Progress Notes (Signed)
Repeat BS 113. This afternoon pt has become impulsive... She was found oob  and ambulatin in the room gait slightly unsteady. She swayed forward. Reminded pt she was not to get oob without assistance. She verbalized understanding. Bed alarm on. Cont with plan of care.

## 2017-09-14 NOTE — Progress Notes (Signed)
Pt ambulated around the nursing unit with RN standby assist from RN. Sat 92% on RA.  Pt tolerated well...Marland Kitchen.Marland Kitchen.Left up in chair and Call light within reach.

## 2017-09-15 LAB — BASIC METABOLIC PANEL
Anion gap: 11 (ref 5–15)
BUN: 28 mg/dL — AB (ref 6–20)
CALCIUM: 8.7 mg/dL — AB (ref 8.9–10.3)
CO2: 29 mmol/L (ref 22–32)
CREATININE: 1.03 mg/dL — AB (ref 0.44–1.00)
Chloride: 98 mmol/L — ABNORMAL LOW (ref 101–111)
GFR calc Af Amer: 56 mL/min — ABNORMAL LOW (ref >60)
GFR, EST NON AFRICAN AMERICAN: 48 mL/min — AB (ref >60)
Glucose, Bld: 89 mg/dL (ref 65–99)
Potassium: 3.8 mmol/L (ref 3.5–5.1)
SODIUM: 138 mmol/L (ref 135–145)

## 2017-09-15 LAB — CBC
HCT: 39.9 % (ref 36.0–46.0)
Hemoglobin: 12.4 g/dL (ref 12.0–15.0)
MCH: 26.4 pg (ref 26.0–34.0)
MCHC: 31.1 g/dL (ref 30.0–36.0)
MCV: 84.9 fL (ref 78.0–100.0)
Platelets: 303 10*3/uL (ref 150–400)
RBC: 4.7 MIL/uL (ref 3.87–5.11)
RDW: 15 % (ref 11.5–15.5)
WBC: 10.7 10*3/uL — ABNORMAL HIGH (ref 4.0–10.5)

## 2017-09-15 LAB — GLUCOSE, CAPILLARY: GLUCOSE-CAPILLARY: 86 mg/dL (ref 65–99)

## 2017-09-15 LAB — MAGNESIUM: MAGNESIUM: 2.1 mg/dL (ref 1.7–2.4)

## 2017-09-15 MED ORDER — FUROSEMIDE 40 MG PO TABS: 40.0000 mg | ORAL_TABLET | Freq: Every day | ORAL | 0 refills | Status: AC

## 2017-09-15 MED ORDER — POTASSIUM CHLORIDE ER 20 MEQ PO TBCR: 40.0000 meq | EXTENDED_RELEASE_TABLET | Freq: Every day | ORAL | 0 refills | Status: AC

## 2017-09-15 NOTE — Progress Notes (Signed)
Physical Therapy Treatment Patient Details Name: Debbie Parker MRN: [id number] DOB: 28-Jul-1929 Today's Date: 09/15/2017    History of Present Illness 84 year old female with PMH of atrial fibrillation on Eliquis, CAD (s/p CABG 2010, and HTN, admitted 09/09/17 with c/o progressive SOB. Initially due to patient's recent long distance travel, chest CT done which was negative for PE but showed signs of fluid overload. Worked up for acute hypoxic respiratory failure secondary to CHF.  PT Comments    Pt progressing well with mobility. Amb with no DME, requiring intermittent UE support and min guard for balance due to increased R-side lateral sway, especially with head turns. Stability much improved with use of RW. Pt planning to d/c to daughter's home today and leaving for beach vacation with family; educ on fall risk reduction, RW use, and energy conservation with this. If pt remains admitted, will continue to follow acutely.   Follow Up Recommendations  Home health PT;Supervision for mobility/OOB(pt from out of town; d/c to daughter's home)     Engineer, agriculturalquipment Recommendations  Rolling walker with 5" wheels    Recommendations for Other Services       Precautions / Restrictions Precautions Precautions: Fall Restrictions Weight Bearing Restrictions: No    Mobility  Bed Mobility               General bed mobility comments: Received sitting EOB  Transfers Overall transfer level: Needs assistance Equipment used: None Transfers: Sit to/from Stand Sit to Stand: Supervision            Ambulation/Gait Ambulation/Gait assistance: Supervision Ambulation Distance (Feet): 600 Feet Assistive device: Rolling walker (2 wheeled);None Gait Pattern/deviations: Step-through pattern;Decreased stride length;Drifts right/left Gait velocity: Decreased Gait velocity interpretation: <1.8 ft/sec, indicate of risk for recurrent falls General Gait Details: Amb well with RW and supervision for safety.  Amb an additional 400' with no DME, intermittent reaching to hand raill for UE support; increased R-side lateral sway, especially with head turns, requiring intermittent min guard.    Stairs Stairs: (Pt declining; reports comfort ascending steps into daughter's home. Educ on HHA from family for balance if needed)           Wheelchair Mobility    Modified Rankin (Stroke Patients Only)       Balance Overall balance assessment: Needs assistance Sitting-balance support: No upper extremity supported;Feet supported Sitting balance-Leahy Scale: Good     Standing balance support: No upper extremity supported;During functional activity Standing balance-Leahy Scale: Good                              Cognition Arousal/Alertness: Awake/alert Behavior During Therapy: WFL for tasks assessed/performed Overall Cognitive Status: No family/caregiver present to determine baseline cognitive functioning Area of Impairment: Attention;Safety/judgement                   Current Attention Level: Selective     Safety/Judgement: Decreased awareness of deficits     General Comments: Likely baseline cognition      Exercises      General Comments        Pertinent Vitals/Pain Pain Assessment: No/denies pain    Home Living                      Prior Function            PT Goals (current goals can now be found in the care plan section) Progress towards PT goals: Progressing toward  goals    Frequency           PT Plan Discharge plan needs to be updated    Co-evaluation              AM-PAC PT "6 Clicks" Daily Activity  Outcome Measure  Difficulty turning over in bed (including adjusting bedclothes, sheets and blankets)?: None Difficulty moving from lying on back to sitting on the side of the bed? : None Difficulty sitting down on and standing up from a chair with arms (e.g., wheelchair, bedside commode, etc,.)?: A Little Help needed  moving to and from a bed to chair (including a wheelchair)?: A Little Help needed walking in hospital room?: A Little Help needed climbing 3-5 steps with a railing? : A Little 6 Click Score: 20    End of Session Equipment Utilized During Treatment: Gait belt Activity Tolerance: Patient tolerated treatment well Patient left: in bed;with call bell/phone within reach Nurse Communication: Mobility status PT Visit Diagnosis: Unsteadiness on feet (R26.81);Other abnormalities of gait and mobility (R26.89);Difficulty in walking, not elsewhere classified (R26.2)     Time: 4098-1191 PT Time Calculation (min) (ACUTE ONLY): 13 min  Charges:  $Gait Training: 8-22 mins                    G Codes:      Ina Homes, PT, DPT Acute Rehab Services  Pager: [phone]  Malachy Chamber 09/15/2017, 10:05 AM

## 2017-09-15 NOTE — Care Management Note (Signed)
Case Management Note  Patient Details  Name: Debbie SanderJudith Czerwonka MRN: [id number] Date of Birth: 1929/04/20  Subjective/Objective:    Patient for dc today, needs rolling walker , Jermaine with [facility], notified,he will bring to patient's room.  She will be going home with her daughter and her husband, which they will also be going out of town so the walker will help her with her gait.                 Action/Plan: DC home when ready.  Expected Discharge Date:  09/15/17               Expected Discharge Plan:  Home/Self Care  In-House Referral:     Discharge planning Services  CM Consult  Post Acute Care Choice:  Durable Medical Equipment Choice offered to:  Patient  DME Arranged:  Dan HumphreysWalker rolling DME Agency:  [facility]  [facility]     Status of Service:  Completed, signed off  If discussed at Long Length of Stay Meetings, dates discussed:    Additional Comments:  Leone Havenaylor, Halen Antenucci [place], RN 09/15/2017, 9:35 AM

## 2017-09-15 NOTE — Discharge Summary (Addendum)
Physician Discharge Summary  Debbie Parker PRF:[id number] DOB: 1929-12-13 DOA: 09/09/2017  PCP: System, Provider Not In  Admit date: 09/09/2017 Discharge date: 09/15/2017  Time spent:  69mnutes  Recommendations for Outpatient Follow-up:  1. PCP Dr.Michael Duperret in 1 week, please check B met at follow-up 2. Cardiology Dr.Gordon WShon Batonin 2 weeks   Discharge Diagnoses:  Principal Problem:   Acute diastolic CHF   Moderate aortic stenosis   Paroxysmal atrial fibrillation   A-fib (HCC)   Acute respiratory failure with hypoxia ([facility]   Discharge Condition: improved  Diet recommendation: low sodium heart healthy  Filed Weights   09/12/17 0144 09/14/17 0435 09/15/17 0120  Weight: 79.6 kg (175 lb 7.8 oz) 79.5 kg (175 lb 4.3 oz) 79 kg (174 lb 2.6 oz)    History of present illness:  84year old female with history of atrial fibrillation on Eliquis, CAD status post CABG in 2010, hypertension, hyperlipidemia came to the hospital with complaints of progressive shortness of breath over 4 days.  Initially due to patient's recent long distance travel she had a CT of the chest done which was negative for pulmonary embolism but showed signs of fluid overload/pulmonary edema   Hospital Course:   Acute hypoxic respiratory failure secondary to CHF -presented to the emergency room with dyspnea for 4 days, progressive weakness and increasing lower extremity edema, [place] prior to that patient and daughter reported noticing lower extremity edema -clinically improved with diuresis  -2-D echocardiogram completed which showed moderate LVH, moderate aortic stenosis mitral and tricuspid regurgitation -she is negative 6 L at the time of discharge, Transitioned to oral Lasix -Weaned off oxygen, low salt diet emphasized -Advised to [place] in 1-2 weeks  History of paroxysmal atrial fibrillation -Patient is on  Toprol-XL 75 mg daily, Eliquis 5 mg twice daily.  Aspirin 81 mg -continued this regimen  Coronary artery disease status post CABG in 2010 -stable, troponins negative -Continue aspirin, statin, beta-blocker  Hyperglycemia -hemoglobin A1c is 5.8 which is consistent with prediabetes   Procedures:  ECHO Impressions: - LVEF 60-65%, moderate LVH, normal wall motion, moderate aortic   stenosis - mean gradient 18 mmHG, AVA around 1.1 cm2, moderate   MR, severe biatrial enlargement, moderate TR, RVSP 55 mmHg,   dilated IVC, aneuysmal interatrial septum - PFO cannot be   excluded, left pleural effusion.  Discharge Exam: Vitals:   09/14/17 2358 09/15/17 0834  BP: 111/76 122/81  Pulse: 66 (!) 54  Resp:  20  Temp: 97.8 F (36.6 C) 98.1 F (36.7 C)  SpO2: 95% (!) 88%    General: alert awake oriented 3 Cardiovascular: as and S2/regular rate rhythm Respiratory: rare rhonchi, improved rales  Discharge Instructions   Discharge Instructions    Diet - low sodium heart healthy   Complete by:  As directed    Increase activity slowly   Complete by:  As directed      Allergies as of 09/15/2017   No Known Allergies     Medication List    STOP taking these medications   amLODipine 10 MG tablet Commonly known as:  NORVASC     TAKE these medications   albuterol 108 (90 Base) MCG/ACT inhaler Commonly known as:  PROVENTIL HFA;VENTOLIN HFA Inhale 1-2 puffs into the lungs every 6 (six) hours as needed for wheezing or shortness of breath.   aspirin EC 81 MG tablet Take 81 mg by mouth daily.  cholecalciferol 1000 units tablet Commonly known as:  VITAMIN D Take 2,000 Units by mouth daily.   ELIQUIS 5 MG Tabs tablet Generic drug:  apixaban Take 5 mg by mouth every morning.   furosemide 40 MG tablet Commonly known as:  LASIX Take 1 tablet (40 mg total) by mouth daily.   metoprolol succinate 50 MG 24 hr tablet Commonly known as:  TOPROL-XL Take 75 mg by mouth daily. Take  with or immediately following a meal.   Potassium Chloride ER 20 MEQ Tbcr Take 40 mEq by mouth daily.   rosuvastatin 20 MG tablet Commonly known as:  CRESTOR Take 20 mg by mouth daily.            Durable Medical Equipment  (From admission, onward)        Start     Ordered   09/12/17 1536  For home use only DME Walker rolling  Once    Question:  Patient needs a walker to treat with the following condition  Answer:  Weakness   09/12/17 1536     No Known Allergies Follow-up [facility] Follow up.   Why:  rolling walker Contact information: [address] [place] [address] [phone]            The results of significant diagnostics from this hospitalization (including imaging, microbiology, ancillary and laboratory) are listed below for reference.    Significant Diagnostic Studies: Ct Angio Chest Pe W And/or Wo Contrast  Result Date: 09/10/2017 CLINICAL DATA:  84 y/o  F; 1 week of shortness of breath. EXAM: CT ANGIOGRAPHY CHEST WITH CONTRAST TECHNIQUE: Multidetector CT imaging of the chest was performed using the standard protocol during bolus administration of intravenous contrast. Multiplanar CT image reconstructions and MIPs were obtained to evaluate the vascular anatomy. CONTRAST:  71m ISOVUE-370 IOPAMIDOL (ISOVUE-370) INJECTION 76% COMPARISON:  09/09/2017 chest radiograph. FINDINGS: Cardiovascular: 4 cm ascending aorta. Moderate calcific atherosclerosis of aorta. Status post CABG with LIMA and saphenous grafts. Cardiomegaly with severe biatrial enlargement. Coronary artery and aortic valvular calcific atherosclerosis. Respiratory motion artifact. Satisfactory opacification of pulmonary arteries. No pulmonary embolus identified. Mediastinum/Nodes: Thyroid isthmus nodule measuring 4.4 x 3.7 cm. No mediastinal adenopathy. Small hiatal hernia. Lungs/Pleura: Small right larger than left pleural effusions. Smooth interlobular  septal thickening. Patchy dependent opacities in the lower lobes. Upper Abdomen: No acute abnormality. Musculoskeletal: No chest wall abnormality. No acute or significant osseous findings. Review of the MIP images confirms the above findings. IMPRESSION: 1. No pulmonary embolus identified. 2. Interstitial pulmonary edema and small bilateral pleural effusions. Mild dependent atelectasis in the lower lobes. 3. 4 cm ascending aortic aneurysm. Recommend annual imaging followup by CTA or MRA. This recommendation follows 2010 ACCF/AHA/AATS/ACR/ASA/SCA/SCAI/SIR/STS/SVM Guidelines for the Diagnosis and Management of Patients with Thoracic Aortic Disease. Circulation. 2010; 121:: [id number] 4. Cardiomegaly with severe biatrial enlargement. 5. Coronary artery, aortic, and aortic valvular calcific atherosclerosis. 6. Thyroid isthmus nodule measuring up to 4.4 cm. Thyroid ultrasound is recommended on a nonemergent basis. 7. Small hiatal hernia. Electronically Signed   By: LKristine GarbeM.D.   On: 09/10/2017 01:18   Dg Chest Port 1 View  Result Date: 09/10/2017 CLINICAL DATA:  Acute onset of shortness of breath. Facial swelling. Wheezing. EXAM: PORTABLE CHEST 1 VIEW COMPARISON:  None. FINDINGS: The lungs are well-aerated. Small bilateral pleural effusions are noted. Increased interstitial markings may reflect mild interstitial edema. No pneumothorax is seen. The cardiomediastinal silhouette is mildly enlarged. The  patient is status post median sternotomy. No acute osseous abnormalities are seen. IMPRESSION: Small bilateral pleural effusions noted. Increased interstitial markings may reflect mild interstitial edema. Mild cardiomegaly. Electronically Signed   By: Garald Balding M.D.   On: 09/10/2017 00:03    Microbiology: No results found for this or any previous visit (from the past 240 hour(s)).   Labs: Basic Metabolic Panel: Recent Labs  Lab 09/11/17 0243 09/12/17 0327 09/13/17 0412 09/14/17 0310  09/15/17 0302  NA 138 137 137 139 138  K 3.2* 4.1 3.7 4.4 3.8  CL 98* 102 98* 101 98*  CO2 _0 GLUCOSE 128* 137* 105* 97 89  BUN 21* 26* 23* 20 28*  CREATININE 0.96 0.94 0.92 0.88 1.03*  CALCIUM 8.8* 8.5* 9.0 9.1 8.7*  MG 1.8 2.0 2.3 2.2 2.1   Liver Function Tests: No results for input(s): AST, ALT, ALKPHOS, BILITOT, PROT, ALBUMIN in the last 168 hours. No results for input(s): LIPASE, AMYLASE in the last 168 hours. No results for input(s): AMMONIA in the last 168 hours. CBC: Recent Labs  Lab 09/10/17 0001 09/10/17 0006 09/14/17 0310 09/15/17 0302  WBC 8.7  --  12.0* 10.7*  NEUTROABS 6.9  --   --   --   HGB 10.6* 11.2* 11.4* 12.4  HCT 33.5* 33.0* 37.5 39.9  MCV 84.4  --  86.4 84.9  PLT 241  --  293 303   Cardiac Enzymes: No results for input(s): CKTOTAL, CKMB, CKMBINDEX, TROPONINI in the last 168 hours. BNP: BNP (last 3 results) Recent Labs    09/10/17 0001  BNP 393.0*    ProBNP (last 3 results) No results for input(s): PROBNP in the last 8760 hours.  CBG: Recent Labs  Lab 09/14/17 1236 09/14/17 1321 09/14/17 1632 09/14/17 2114 09/15/17 0800  GLUCAP 69 113* 108* 97 86       Signed:  Domenic Polite MD.  [facility] 09/15/2017, 10:39 AM

## 2019-04-29 IMAGING — CT CT ANGIO CHEST
2 of 8 series · 18 of 36 positions shown · IV contrast (OMNI)
Comparison: 09/09/2017 chest radiograph.

CLINICAL DATA: 87 y/o  F; 1 week of shortness of breath.

EXAM:
CT ANGIOGRAPHY CHEST WITH CONTRAST
TECHNIQUE: Multidetector CT imaging of the chest was performed using the
standard protocol during bolus administration of intravenous
contrast. Multiplanar CT image reconstructions and MIPs were
obtained to evaluate the vascular anatomy.
CONTRAST:  65mL UBAYA2-6ZA IOPAMIDOL (UBAYA2-6ZA) INJECTION 76%

[Series 6: thins · axial · 0.65mm/px · z∈[+1130,+1402]mm · 17 of 303 slices shown]
[im 16/303  lung]
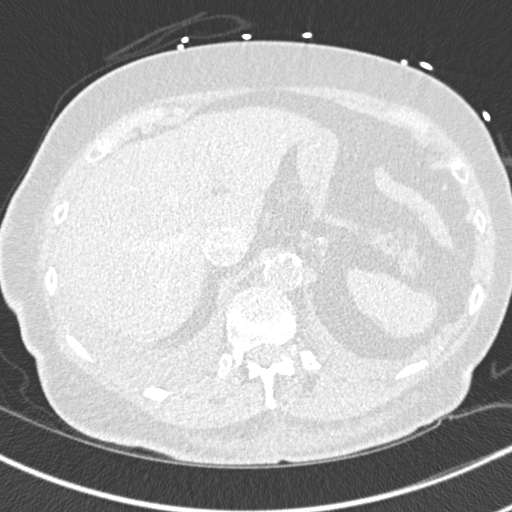
[im 32/303  mediastinal]
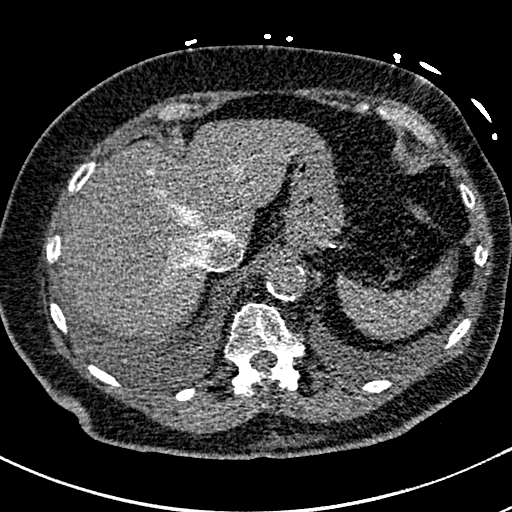
[im 48/303  lung]
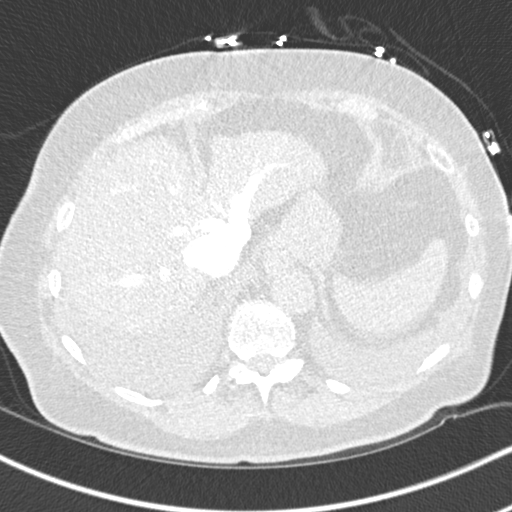
[im 64/303  mediastinal]
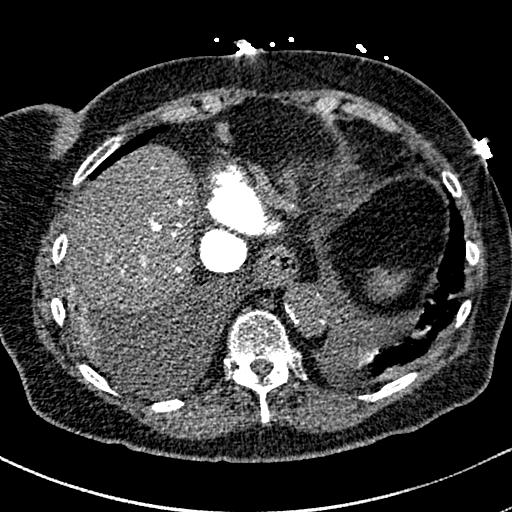
[im 80/303  lung]
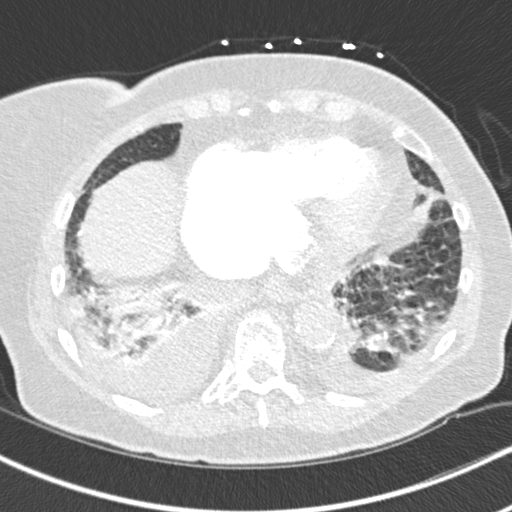
[im 96/303  mediastinal]
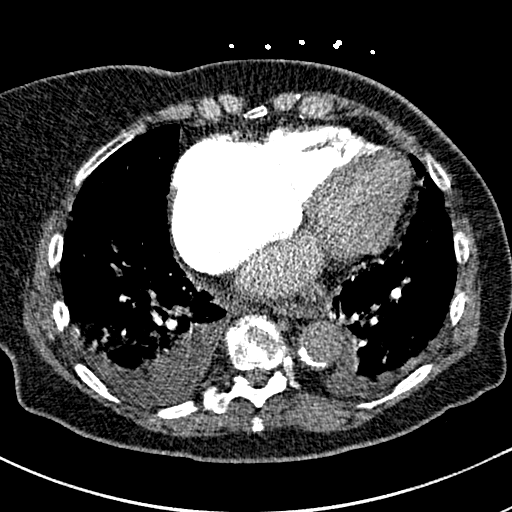
[im 112/303  lung]
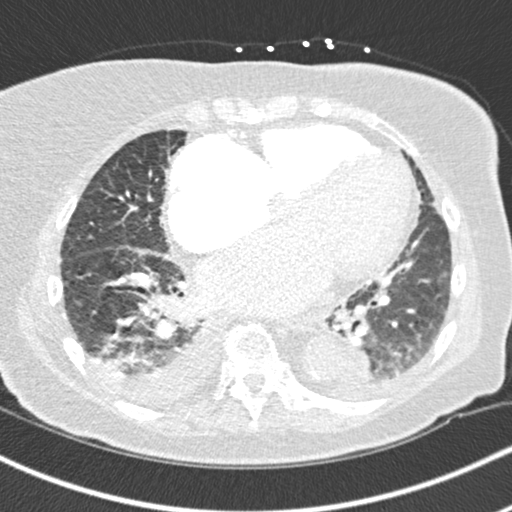
[im 128/303  mediastinal]
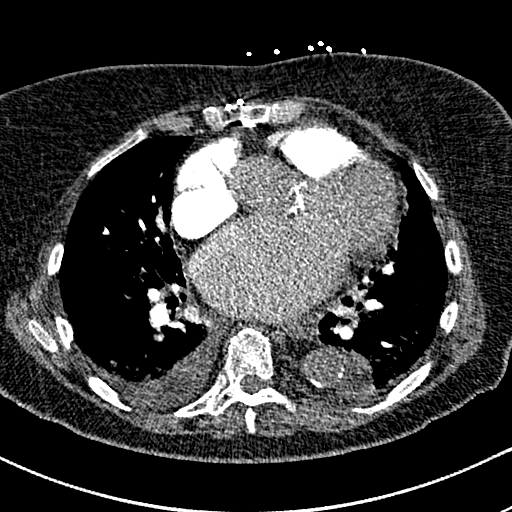
[im 159/303  lung]
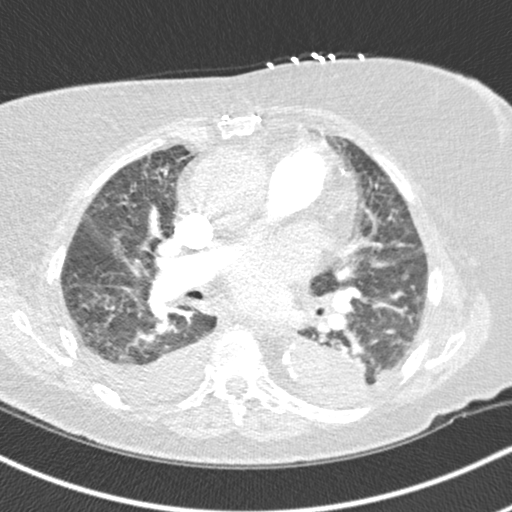
[im 175/303  mediastinal]
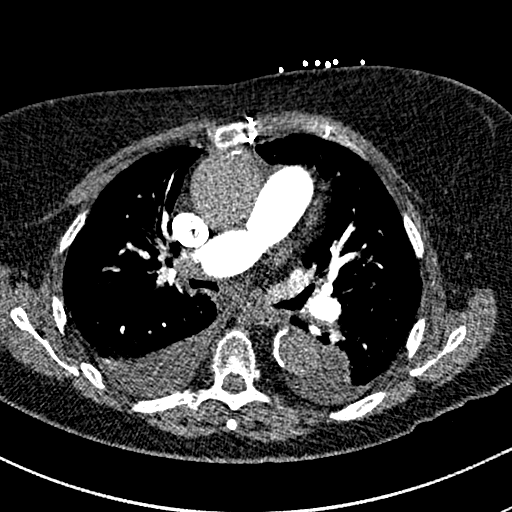
[im 191/303  lung]
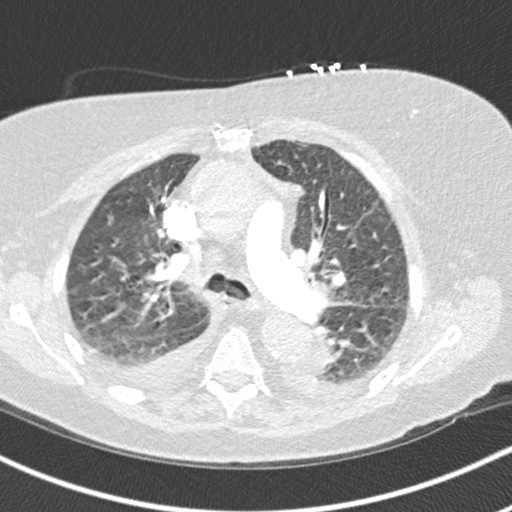
[im 207/303  mediastinal]
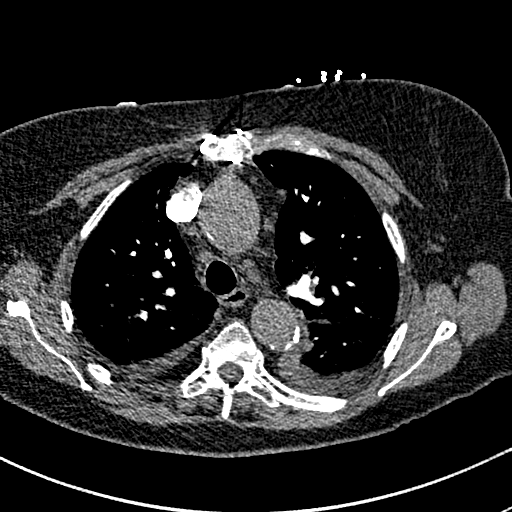
[im 223/303  lung]
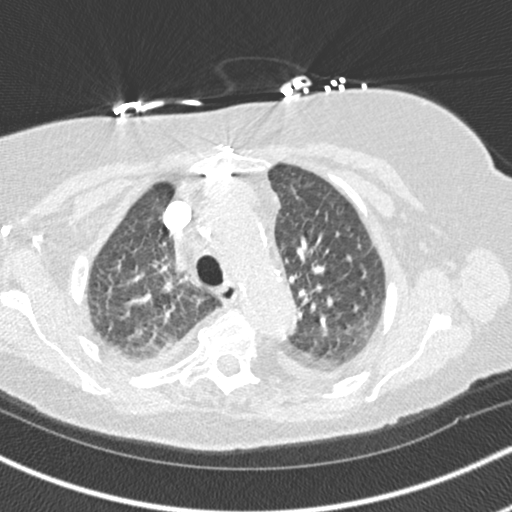
[im 239/303  mediastinal]
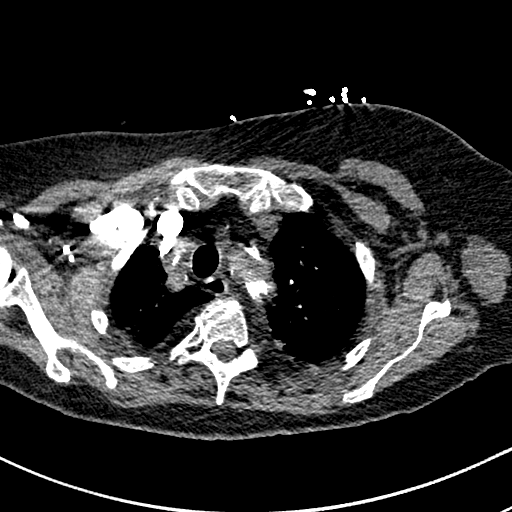
[im 255/303  lung]
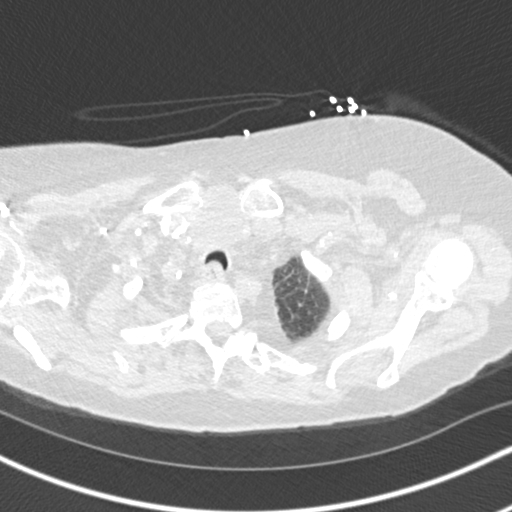
[im 271/303  mediastinal]
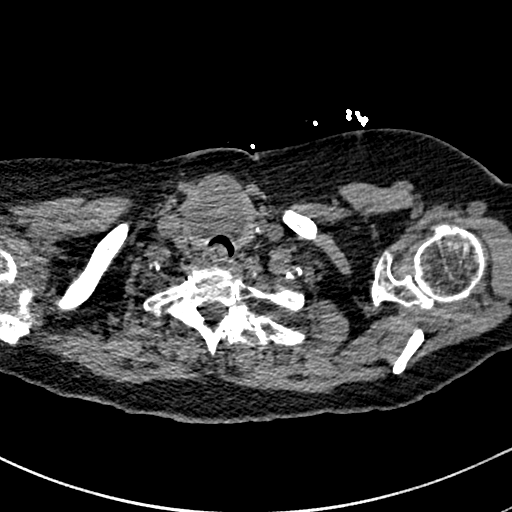
[im 287/303  lung]
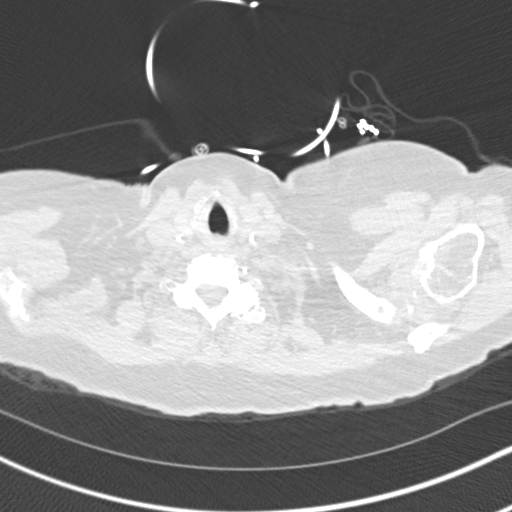

[Series 8: coronal mpr · coronal · 0.59mm/px · 1 of 128 slices shown]
[im 64/128  mediastinal]
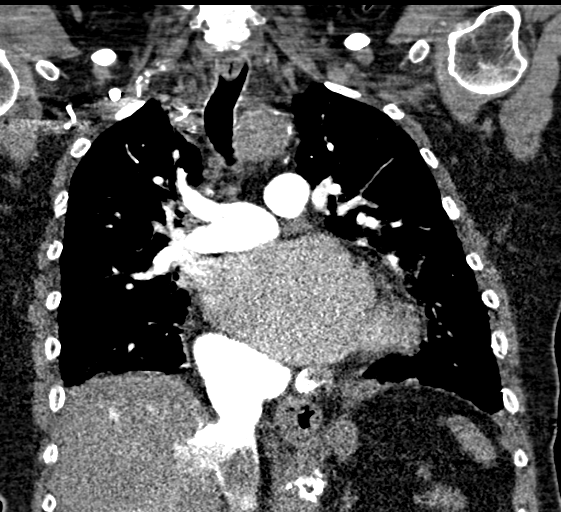

[18 of 36 positions shown; findings below may reference images not displayed]

FINDINGS: Cardiovascular: 4 cm ascending aorta. Moderate calcific
atherosclerosis of aorta. Status post CABG with [REDACTED] and saphenous
grafts. Cardiomegaly with severe biatrial enlargement. Coronary
artery and aortic valvular calcific atherosclerosis.

Respiratory motion artifact. Satisfactory opacification of pulmonary
arteries. No pulmonary embolus identified.

Mediastinum/Nodes: Thyroid isthmus nodule measuring 4.4 x 3.7 cm. No
mediastinal adenopathy. Small hiatal hernia.

Lungs/Pleura: Small right larger than left pleural effusions. Smooth
interlobular septal thickening. Patchy dependent opacities in the
lower lobes.

Upper Abdomen: No acute abnormality.

Musculoskeletal: No chest wall abnormality. No acute or significant
osseous findings.

Review of the MIP images confirms the above findings.
IMPRESSION: 1. No pulmonary embolus identified.
2. Interstitial pulmonary edema and small bilateral pleural
effusions. Mild dependent atelectasis in the lower lobes.
3. 4 cm ascending aortic aneurysm. Recommend annual imaging followup
by CTA or MRA. This recommendation follows 9111
ACCF/AHA/AATS/ACR/ASA/SCA/NLMB/LEGRAND/SOURABH/SYVERT Guidelines for the
Diagnosis and Management of Patients with Thoracic Aortic Disease.
Circulation. 9111; 121: E266-e369.
4. Cardiomegaly with severe biatrial enlargement.
5. Coronary artery, aortic, and aortic valvular calcific
atherosclerosis.
6. Thyroid isthmus nodule measuring up to 4.4 cm. Thyroid ultrasound
is recommended on a nonemergent basis.
7. Small hiatal hernia.

By: Luka-Mihael Zagrajski M.D.
